# Patient Record
Sex: Female | Born: 1985 | Race: White | Hispanic: No | Marital: Single | Smoking: Former smoker
Health system: Southern US, Community
[De-identification: ages and names within clinical notes are randomized; demographics above are authoritative.]

## PROBLEM LIST (undated history)

## (undated) DIAGNOSIS — R011 Cardiac murmur, unspecified: Secondary | ICD-10-CM

## (undated) DIAGNOSIS — F419 Anxiety disorder, unspecified: Secondary | ICD-10-CM

## (undated) DIAGNOSIS — F32A Depression, unspecified: Secondary | ICD-10-CM

## (undated) HISTORY — DX: Depression, unspecified: F32.A

## (undated) HISTORY — PX: TUBAL LIGATION: SHX77

## (undated) HISTORY — DX: Anxiety disorder, unspecified: F41.9

---

## 2020-03-13 ENCOUNTER — Emergency Department (INDEPENDENT_AMBULATORY_CARE_PROVIDER_SITE_OTHER): Payer: Self-pay

## 2020-03-13 ENCOUNTER — Other Ambulatory Visit: Payer: Self-pay

## 2020-03-13 ENCOUNTER — Encounter: Payer: Self-pay | Admitting: Emergency Medicine

## 2020-03-13 ENCOUNTER — Emergency Department (INDEPENDENT_AMBULATORY_CARE_PROVIDER_SITE_OTHER)
Admission: EM | Admit: 2020-03-13 | Discharge: 2020-03-13 | Disposition: A | Discharge status: Home / Self Care | Payer: Self-pay | Source: Home / Self Care | Attending: Family Medicine | Admitting: Family Medicine

## 2020-03-13 DIAGNOSIS — M25531 Pain in right wrist: Secondary | ICD-10-CM

## 2020-03-13 DIAGNOSIS — M654 Radial styloid tenosynovitis [de Quervain]: Secondary | ICD-10-CM

## 2020-03-13 DIAGNOSIS — M79644 Pain in right finger(s): Secondary | ICD-10-CM

## 2020-03-13 HISTORY — DX: Cardiac murmur, unspecified: R01.1

## 2020-03-13 MED ORDER — PREDNISONE 20 MG PO TABS
ORAL_TABLET | ORAL | 0 refills | Status: DC
Start: 2020-03-13 — End: 2022-02-09

## 2020-03-13 NOTE — ED Triage Notes (Signed)
Woke up yesterday with wrist pain, swelling, denies injury.Shucks oysters for a living

## 2020-03-13 NOTE — ED Provider Notes (Signed)
[facility]    CSN: [id number] Arrival date & time: 03/13/20  1508      History   Chief Complaint Chief Complaint  Patient presents with  . Wrist Pain    HPI Katrina Pineda is a 34 y.o. female.   Patient awoke yesterday with pain in her right thumb and wrist.  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  She denies paresthesias but states that she has right hand weakness.  The history is provided by the patient.  Hand Pain This is a new problem. The current episode started yesterday. The problem occurs constantly. The problem has been gradually worsening. The symptoms are aggravated by coughing (grasping). Nothing relieves the symptoms. Treatments tried: ice pack. The treatment provided no relief.    Past Medical History:  Diagnosis Date  . Heart murmur     There are no problems to display for this patient.   History reviewed. No pertinent surgical history.  OB History   No obstetric history on file.      Home Medications    Prior to Admission medications   Medication Sig Start Date End Date Taking? Authorizing Provider  ibuprofen (ADVIL) 600 MG tablet Take 600 mg by mouth every 6 (six) hours as needed.   Yes [provider]  predniSONE (DELTASONE) 20 MG tablet Take one tab by mouth twice daily for 4 days, then one daily. Take with food. 03/13/20   Lattie Haw, MD    Family History Family History  Problem Relation Age of Onset  . Cancer Mother   . Diabetes Father     Social History Social History   Tobacco Use  . Smoking status: Current Every Day Smoker    Packs/day: 1.00    Types: Cigarettes  . Smokeless tobacco: Never Used  Vaping Use  . Vaping Use: Never used  Substance Use Topics  . Alcohol use: Yes  . Drug use: Never     Allergies   Patient has no allergy information on record.   Review of Systems Review of Systems  Constitutional: Negative for activity change, chills,  diaphoresis, fatigue and fever.  Musculoskeletal: Positive for joint swelling.  Skin: Negative.   Neurological: Positive for weakness. Negative for numbness.     Physical Exam Triage Vital Signs ED Triage Vitals  Enc Vitals Group     BP 03/13/20 1538 127/88     Pulse Rate 03/13/20 1538 82     Resp --      Temp 03/13/20 1538 98.3 F (36.8 C)     Temp Source 03/13/20 1538 Oral     SpO2 03/13/20 1538 96 %     Weight 03/13/20 1539 200 lb (90.7 kg)     Height 03/13/20 1539 5\' 4"  (1.626 m)     Head Circumference --      Peak Flow --      Pain Score 03/13/20 1538 5     Pain Loc --      Pain Edu? --      Excl. in GC? --    No data found.  Updated Vital Signs BP 127/88 (BP Location: Right Arm)   Pulse 82   Temp 98.3 F (36.8 C) (Oral)   Ht 5\' 4"  (1.626 m)   Wt 90.7 kg   SpO2 96%   BMI 34.33 kg/m   Visual Acuity Right Eye Distance:   Left Eye Distance:   Bilateral Distance:    Right  Eye Near:   Left Eye Near:    Bilateral Near:     Physical Exam Constitutional:      General: She is not in acute distress. HENT:     Head: Normocephalic.  Eyes:     Pupils: Pupils are equal, round, and reactive to light.  Cardiovascular:     Rate and Rhythm: Normal rate.  Pulmonary:     Effort: Pulmonary effort is normal.  Musculoskeletal:        General: Swelling and tenderness present.     Right wrist: Tenderness and bony tenderness present. No deformity, effusion, lacerations, snuff box tenderness or crepitus. Decreased range of motion. Normal pulse.       Hands:     Comments: Right thumb/wrist dorsally have distinct tenderness over the extensor tendons.  Palpation there during resisted extension and abduction of the thumb recreate her pain.  Distal neurovascular function is intact.    Skin:    General: Skin is warm and dry.  Neurological:     Mental Status: She is alert and oriented to person, place, and time.      UC Treatments / Results  Labs (all labs ordered are  listed, but only abnormal results are displayed) Labs Reviewed - No data to display  EKG   Radiology DG Wrist Complete Right  Result Date: 03/13/2020 CLINICAL DATA:  RIGHT wrist and thumb pain. EXAM: RIGHT WRIST - COMPLETE 3+ VIEW COMPARISON:  None. FINDINGS: There is no evidence of fracture or dislocation. There is no evidence of arthropathy or other focal bone abnormality. Soft tissues are unremarkable. IMPRESSION: Negative. Electronically Signed   By: Norva Pavlov M.D.   On: 03/13/2020 17:01    Procedures Procedures (including critical care time)  Medications Ordered in UC Medications - No data to display  Initial Impression / Assessment and Plan / UC Course  I have reviewed the triage vital signs and the nursing notes.  Pertinent labs & imaging results that were available during my care of the patient were reviewed by me and considered in my medical decision making (see chart for details).    Dispensed thumb spica splint.  Begin prednisone burst/taper. Given treatment instructions with range of motion and stretching exercises.  Followup with Dr. [facility]ney [facility] ([facility]) if not improving about two weeks.    Final Clinical Impressions(s) / UC Diagnoses   Final diagnoses:  Tendinitis, de Quervain's     Discharge Instructions     Apply ice pack for 20 to 30 minutes, 3 to 4 times daily  Continue until pain and swelling decrease.  Wear splint until pain resolves.  Begin range of motion and stretching exercises as tolerated. May take Tylenol as needed for pain.    ED Prescriptions    Medication Sig Dispense Auth. Provider   predniSONE (DELTASONE) 20 MG tablet Take one tab by mouth twice daily for 4 days, then one daily. Take with food. 12 tablet Lattie Haw, MD        Lattie Haw, MD 03/14/20 [phone]

## 2020-03-13 NOTE — Discharge Instructions (Addendum)
Apply ice pack for 20 to 30 minutes, 3 to 4 times daily  Continue until pain and swelling decrease.  Wear splint until pain resolves.  Begin range of motion and stretching exercises as tolerated. May take Tylenol as needed for pain.

## 2021-08-21 IMAGING — DX DG WRIST COMPLETE 3+V*R*
4 series · 4 of 4 positions shown · non-contrast
Comparison: None.

CLINICAL DATA: RIGHT wrist and thumb pain.

EXAM:
RIGHT WRIST - COMPLETE 3+ VIEW

[wrist pa]
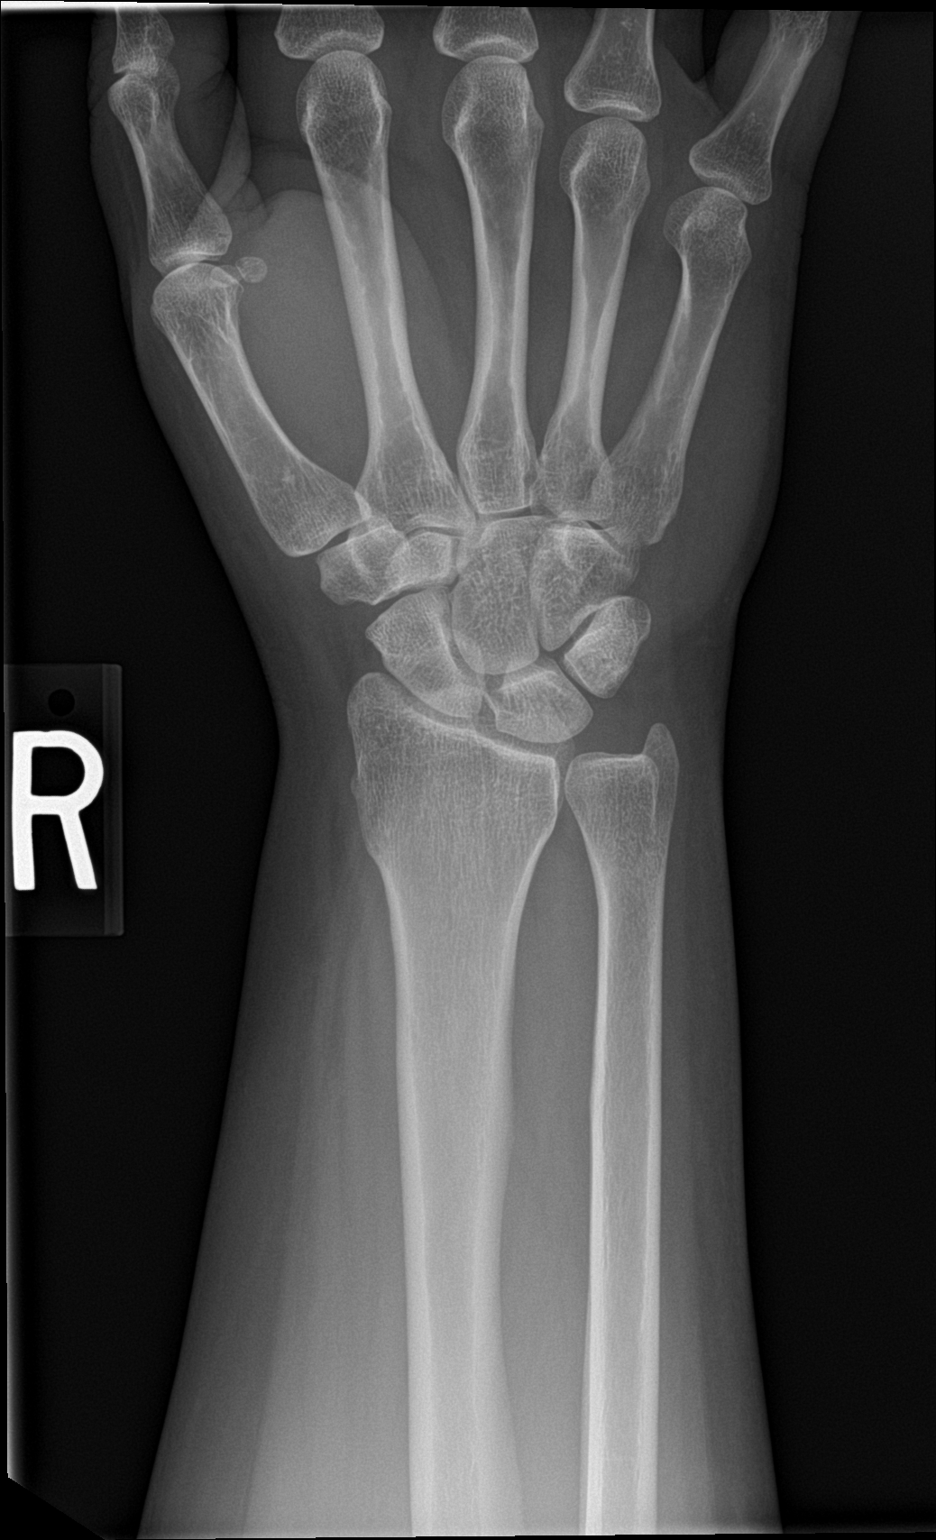

[wrist obl]
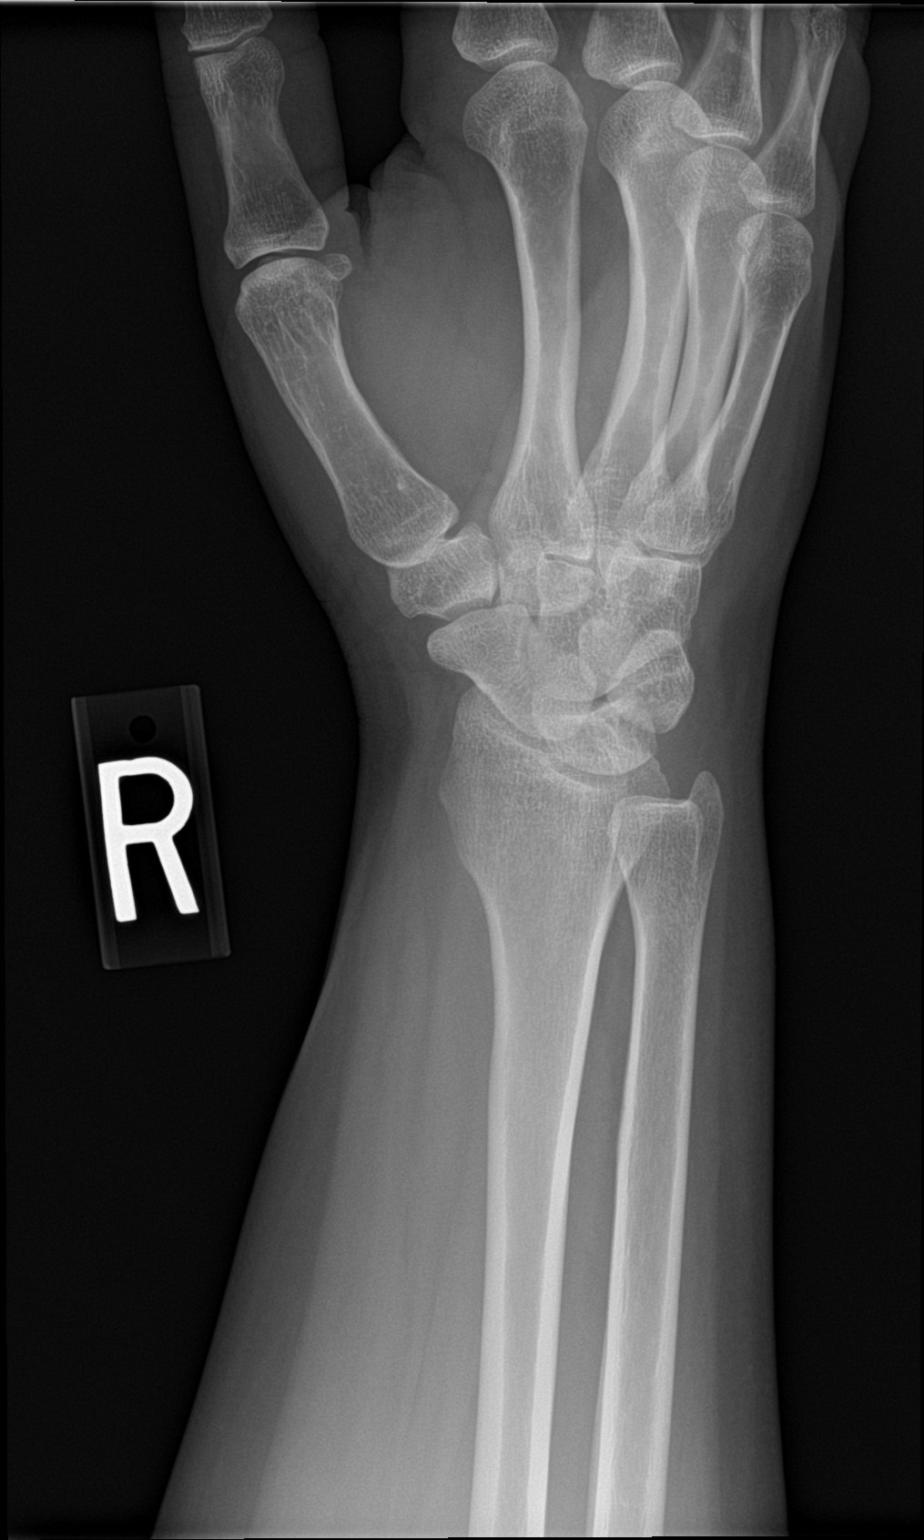

[wrist lat]
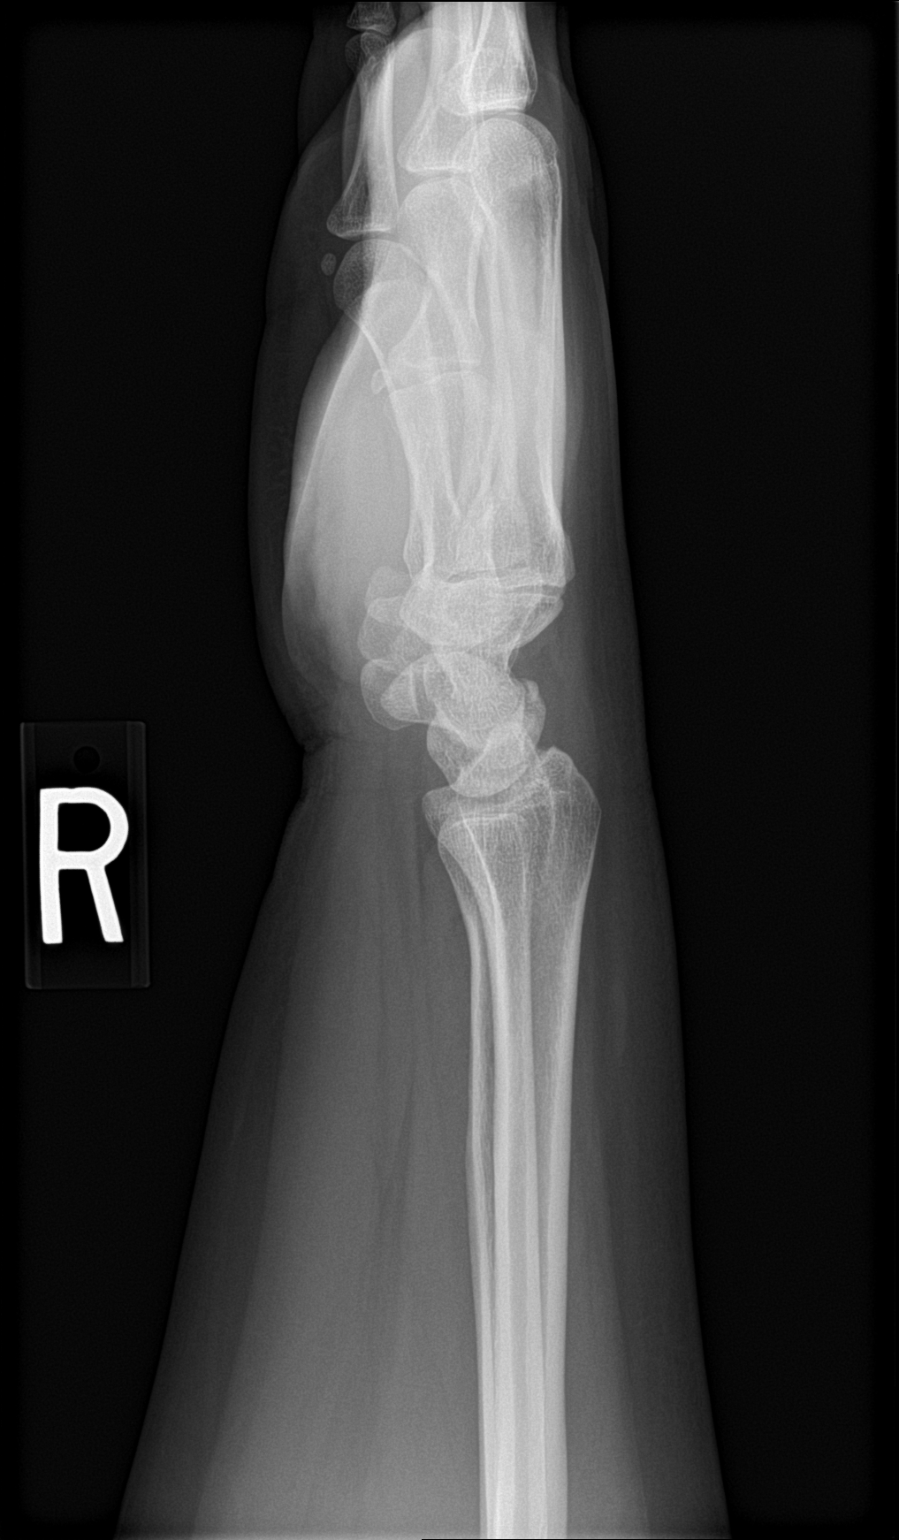

[wrist navicular]
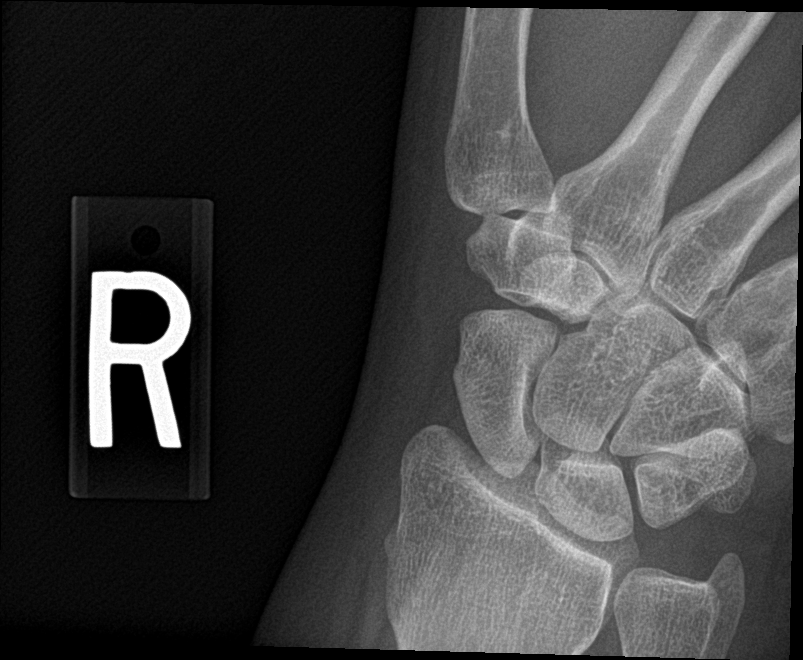

[4 of 4 positions shown; findings below may reference images not displayed]

FINDINGS: There is no evidence of fracture or dislocation. There is no
evidence of arthropathy or other focal bone abnormality. Soft
tissues are unremarkable.
IMPRESSION: Negative.

## 2021-12-15 ENCOUNTER — Ambulatory Visit: Payer: Self-pay | Admitting: Family Medicine

## 2021-12-26 ENCOUNTER — Ambulatory Visit: Payer: Self-pay | Admitting: Family Medicine

## 2022-02-09 ENCOUNTER — Ambulatory Visit (INDEPENDENT_AMBULATORY_CARE_PROVIDER_SITE_OTHER): Payer: Commercial Managed Care - PPO | Admitting: Family Medicine

## 2022-02-09 ENCOUNTER — Encounter: Payer: Self-pay | Admitting: Family Medicine

## 2022-02-09 VITALS — BP 107/67 | HR 82 | Temp 98.1°F | Ht 64.0 in | Wt 248.0 lb

## 2022-02-09 DIAGNOSIS — Z7689 Persons encountering health services in other specified circumstances: Secondary | ICD-10-CM

## 2022-02-09 DIAGNOSIS — Z3A25 25 weeks gestation of pregnancy: Secondary | ICD-10-CM

## 2022-02-09 DIAGNOSIS — F32A Depression, unspecified: Secondary | ICD-10-CM | POA: Diagnosis not present

## 2022-02-09 DIAGNOSIS — F419 Anxiety disorder, unspecified: Secondary | ICD-10-CM

## 2022-02-09 DIAGNOSIS — R011 Cardiac murmur, unspecified: Secondary | ICD-10-CM | POA: Diagnosis not present

## 2022-02-09 NOTE — Patient Instructions (Signed)
Exercise During Pregnancy Exercise is an important part of being healthy for people of all ages. Exercise improves the function of your heart and lungs and helps you maintain strength, flexibility, and a healthy body weight. Exercise also boosts energy levels and elevates mood. Most women should exercise regularly during pregnancy. Exercise routines may need to change as your pregnancy progresses. In rare cases, women with certain medical conditions or complications may be asked to limit or avoid exercise during pregnancy. Your health care provider will give you information on what will work for you. How does this affect me? Along with maintaining general strength and flexibility, exercising during pregnancy can help: Keep strength in muscles that are used during labor and childbirth. Decrease low back pain or symptoms of depression. Control weight gain during pregnancy. Reduce the risk of needing insulin if you develop diabetes during pregnancy. Decrease the risk of cesarean delivery. Speed up your recovery after giving birth. Relieve constipation. How does this affect my baby? Exercise can help you have a healthy pregnancy. Exercise does not cause early (premature) birth. It will not cause your baby to weigh less at birth. What exercises can I do? Many exercises are safe for you to do during pregnancy. Do a variety of exercises that safely increase your heart and breathing rates and help you build and maintain muscle strength. Do exercises exactly as told by your health care provider. You may do these exercises: Walking. Swimming. Water aerobics. Riding a stationary bike. Modified yoga or Pilates. Tell your instructor that you are pregnant. Avoid overstretching, and avoid lying on your back for long periods of time. Running or jogging. Choose this type of exercise only if: You ran or jogged regularly before your pregnancy. You can run or jog and still talk in complete sentences. What  exercises should I avoid? You may be told to limit high-intensity exercise depending on your level of fitness and whether you exercised regularly before you were pregnant. You can tell that you are exercising at a high intensity if you are breathing much harder and faster and cannot hold a conversation while exercising. You must avoid: Contact sports. Activities that put you at risk for falling on or being hit in the belly, such as downhill skiing, waterskiing, surfing, rock climbing, cycling, gymnastics, and horseback riding. Scuba diving. Skydiving. Hot yoga or hot Pilates. These activities take place in a room that is heated to high temperatures. Jogging or running, unless you jogged or ran regularly before you were pregnant. While jogging or running, you should always be able to talk in full sentences. Do not run or jog so fast that you are unable to have a conversation. Do not exercise at more than 6,000 feet above sea level (high elevation) if you are not used to exercising at high elevation. How do I exercise in a safe way?  Avoid overheating. Do not exercise in very high temperatures. Wear loose-fitting, breathable clothes. Avoid dehydration. Drink enough fluid before, during, and after exercise to keep your urine pale yellow. Avoid overstretching. Because of hormone changes during pregnancy, it is easy to overstretch muscles, tendons, and ligaments. Start slowly and ask your health care provider to recommend the types of exercise that are safe for you. Do not exercise to lose weight. Wear a sports bra to support your breasts. Avoid standing still or lying flat on your back as much as you can. Follow these instructions at home: Exercise on most days or all days of the week. Try to   exercise for 30 minutes a day, 5 days a week, unless your health care provider tells you not to. If you actively exercised before your pregnancy and you are healthy, your health care provider may tell you to  continue to do moderate-intensity to high-intensity exercise. If you are just starting to exercise or did not exercise much before your pregnancy, your health care provider may tell you to do low-intensity to moderate-intensity exercise. Questions to ask your health care provider Is exercise safe for me? What are signs that I should stop exercising? Does my health condition mean that I should not exercise during pregnancy? When should I avoid exercising during pregnancy? Stop exercising and contact a health care provider if: You have any unusual symptoms such as: Mild contractions of the uterus or cramps in the abdomen. A dizzy feeling that does not go away when you rest. Stop exercising and get help right away if: You have any unusual symptoms such as: Sudden, severe pain in your low back or your belly. Regular, painful contractions of your uterus. Chest pain. Bleeding or fluid leaking from your vagina. Shortness of breath. Headache. Pain and swelling of your calves. Summary Most women should exercise regularly throughout pregnancy. In rare cases, women with certain medical conditions or complications may be asked to limit or avoid exercise during pregnancy. Do not exercise to lose weight during pregnancy. Your health care provider will tell you what level of physical activity is right for you. Stop exercising and contact a health care provider if you have unusual symptoms, such as mild contractions or dizziness. This information is not intended to replace advice given to you by your health care provider. Make sure you discuss any questions you have with your health care provider. Document Revised: 03/27/2020 Document Reviewed: 03/27/2020 Elsevier Patient Education  2023 [employer]  

## 2022-02-09 NOTE — Progress Notes (Signed)
New Patient Office Visit  Subjective    Patient ID: Katrina Pineda, female    DOB: December 14, 1985  Age: 35 y.o. MRN: [id number]  CC:  Chief Complaint  Patient presents with   New Patient (Initial Visit)    HPI Katrina Pineda presents to establish care.  She is [redacted] weeks pregnant. She is managed by [facility] in [place]. She reports a healthy pregnancy so far. This is her second pregnancy. She has been on zoloft for the last few months for anxiety and depression. Reports working well without side effects.   She reports a heart murmur since childhood. She was evaluated by cardiology as a child but has not been since since she was a teenager. She was told that she would outgrow this. She reports that she has been told off and no throughout adulthood that she has a murmur. Denies chest pain, shortness of breath, edema, or dizziness.      02/09/2022    2:19 PM  Depression screen PHQ 2/9  Decreased Interest 0  Down, Depressed, Hopeless 0  PHQ - 2 Score 0  Altered sleeping 0  Tired, decreased energy 0  Change in appetite 0  Feeling bad or failure about yourself  0  Trouble concentrating 0  Moving slowly or fidgety/restless 0  Suicidal thoughts 0  PHQ-9 Score 0  Difficult doing work/chores Not difficult at all      02/09/2022    2:20 PM  GAD 7 : Generalized Anxiety Score  Nervous, Anxious, on Edge 0  Control/stop worrying 0  Worry too much - different things 0  Trouble relaxing 0  Restless 0  Easily annoyed or irritable 0  Afraid - awful might happen 0  Total GAD 7 Score 0  Anxiety Difficulty Not difficult at all     Outpatient Encounter Medications as of 02/09/2022  Medication Sig   Prenatal Vit-Fe Fumarate-FA (PRENATAL VITAMINS PO) Take by mouth.   sertraline (ZOLOFT) 50 MG tablet Take 50 mg by mouth daily.   [DISCONTINUED] ibuprofen (ADVIL) 600 MG tablet Take 600 mg by mouth every 6 (six) hours as needed.   [DISCONTINUED] predniSONE (DELTASONE) 20 MG tablet Take one  tab by mouth twice daily for 4 days, then one daily. Take with food.   No facility-administered encounter medications on file as of 02/09/2022.    Past Medical History:  Diagnosis Date   Anxiety    Depression    Heart murmur     History reviewed. No pertinent surgical history.  Family History  Problem Relation Age of Onset   Anxiety disorder Mother    Alcohol abuse Mother    Cancer Mother    Hypertension Father    Hyperlipidemia Father    Diabetes Father    Cancer Sister    Cancer Maternal Grandmother    COPD Paternal Grandfather     Social History   Socioeconomic History   Marital status: Married    Spouse name: Onalee Hua   Number of children: 1   Years of education: 11   Highest education level: GED or equivalent  Occupational History   Not on file  Tobacco Use   Smoking status: Former    Packs/day: 1.00    Years: 18.00    Total pack years: 18.00    Types: Cigarettes    Quit date: 2022    Years since quitting: 1.4   Smokeless tobacco: Never  Vaping Use   Vaping Use: Never used  Substance and Sexual Activity   Alcohol  use: Not Currently   Drug use: Never   Sexual activity: Yes    Birth control/protection: None  Other Topics Concern   Not on file  Social History Narrative   Not on file   Social Determinants of Health   Financial Resource Strain: Not on file  Food Insecurity: Not on file  Transportation Needs: Not on file  Physical Activity: Not on file  Stress: Not on file  Social Connections: Not on file  Intimate Partner Violence: Not on file    ROS As per HPI.     Objective    BP 107/67   Pulse 82   Temp 98.1 F (36.7 C) (Temporal)   Ht 5\' 4"  (1.626 m)   Wt 248 lb (112.5 kg)   SpO2 97%   BMI 42.57 kg/m   Physical Exam Vitals and nursing note reviewed.  Constitutional:      General: She is not in acute distress.    Appearance: She is not ill-appearing, toxic-appearing or diaphoretic.  HENT:     Head: Normocephalic and  atraumatic.     Right Ear: Tympanic membrane, ear canal and external ear normal.     Left Ear: Tympanic membrane and external ear normal.     Nose: Nose normal.     Mouth/Throat:     Mouth: Mucous membranes are moist.     Pharynx: Oropharynx is clear.  Neck:     Vascular: No JVD.  Cardiovascular:     Heart sounds: Murmur heard.     Systolic murmur is present with a grade of 3/6.     No friction rub. No gallop. No S3 or S4 sounds.  Pulmonary:     Effort: Pulmonary effort is normal. No respiratory distress.     Breath sounds: Normal breath sounds.  Musculoskeletal:     Right lower leg: No edema.     Left lower leg: No edema.  Skin:    General: Skin is warm and dry.  Neurological:     General: No focal deficit present.     Mental Status: She is alert and oriented to person, place, and time.  Psychiatric:        Mood and Affect: Mood normal.        Thought Content: Thought content normal.        Judgment: Judgment normal.         Assessment & Plan:   Katrina Pineda was seen today for new patient (initial visit).  Diagnoses and all orders for this visit:  Heart murmur Asymptomatic. From childhood.    Morbid obesity (HCC) BMI is 42. Healthy well balanced diet and exercise as tolerated given pregnant stable.   [redacted] weeks gestation of pregnancy Managed by OB.  Anxiety and depression Well controlled with zoloft. Managed by OB.   Encounter to establish care Awaiting records.   Return in about 24 weeks (around 07/27/2022) for CPE.   The patient indicates understanding of these issues and agrees with the plan.  14/11/2021, FNP

## 2022-08-03 ENCOUNTER — Encounter: Payer: Commercial Managed Care - PPO | Admitting: Family Medicine

## 2022-12-31 ENCOUNTER — Encounter: Payer: Self-pay | Admitting: Family Medicine

## 2022-12-31 ENCOUNTER — Ambulatory Visit (INDEPENDENT_AMBULATORY_CARE_PROVIDER_SITE_OTHER): Payer: Commercial Managed Care - PPO

## 2022-12-31 ENCOUNTER — Ambulatory Visit (INDEPENDENT_AMBULATORY_CARE_PROVIDER_SITE_OTHER): Payer: Commercial Managed Care - PPO | Admitting: Family Medicine

## 2022-12-31 VITALS — BP 129/82 | HR 87 | Temp 98.6°F | Ht 64.0 in | Wt 266.1 lb

## 2022-12-31 DIAGNOSIS — F32A Depression, unspecified: Secondary | ICD-10-CM | POA: Diagnosis not present

## 2022-12-31 DIAGNOSIS — F419 Anxiety disorder, unspecified: Secondary | ICD-10-CM

## 2022-12-31 DIAGNOSIS — M5441 Lumbago with sciatica, right side: Secondary | ICD-10-CM

## 2022-12-31 DIAGNOSIS — M5442 Lumbago with sciatica, left side: Secondary | ICD-10-CM

## 2022-12-31 DIAGNOSIS — G8929 Other chronic pain: Secondary | ICD-10-CM | POA: Diagnosis not present

## 2022-12-31 MED ORDER — HYDROXYZINE PAMOATE 25 MG PO CAPS: 25.0000 mg | ORAL_CAPSULE | Freq: Three times a day (TID) | ORAL | 0 refills | Status: DC | PRN

## 2022-12-31 MED ORDER — MELOXICAM 15 MG PO TABS: 15.0000 mg | ORAL_TABLET | Freq: Every day | ORAL | 0 refills | Status: DC

## 2022-12-31 NOTE — Progress Notes (Signed)
Acute Office Visit  Subjective:     Patient ID: Katrina Pineda, female    DOB: 07-17-1986, 37 y.o.   MRN: [id number]  Chief Complaint  Patient presents with   Sciatica    HPI Patient is in today for back pain. This has been ongoing for years and has been worsening. Reports pain is on both side, most often on right. She reports that pain is intermittent. Worsening with activity or standing for long periods. She reports sharp shoot pains that radiates down her back side and back of her legs. She reports intermittent numbness and tingling in both leg and toes at times. She has tried ibuprofen, stretching, heat, and ice with only short term improvement. Denies saddles anesthesia, changes in bowel or bladder control. She has had to limit her activity because of her pain.   She stopped taking zoloft 1-2 months ago. She reports that her symptoms are generally manageable. Would like to have something to have on hand for when her anxiety increases. She does not wish to be on a daily medication.      12/31/2022   10:38 AM 02/09/2022    2:19 PM  Depression screen PHQ 2/9  Decreased Interest 0 0  Down, Depressed, Hopeless 1 0  PHQ - 2 Score 1 0  Altered sleeping 0 0  Tired, decreased energy 2 0  Change in appetite 0 0  Feeling bad or failure about yourself  1 0  Trouble concentrating 0 0  Moving slowly or fidgety/restless 0 0  Suicidal thoughts 0 0  PHQ-9 Score 4 0  Difficult doing work/chores Not difficult at all Not difficult at all      12/31/2022   10:39 AM 02/09/2022    2:20 PM  GAD 7 : Generalized Anxiety Score  Nervous, Anxious, on Edge 1 0  Control/stop worrying 1 0  Worry too much - different things 1 0  Trouble relaxing 1 0  Restless 0 0  Easily annoyed or irritable 2 0  Afraid - awful might happen 0 0  Total GAD 7 Score 6 0  Anxiety Difficulty Not difficult at all Not difficult at all     ROS As per HPI.      Objective:    BP 129/82   Pulse 87   Temp 98.6 F (37  C) (Temporal)   Ht 5\' 4"  (1.626 m)   Wt 266 lb 2 oz (120.7 kg)   SpO2 96%   BMI 45.68 kg/m    Physical Exam Vitals and nursing note reviewed.  Constitutional:      General: She is not in acute distress.    Appearance: She is obese. She is not ill-appearing, toxic-appearing or diaphoretic.  Musculoskeletal:     Lumbar back: No swelling, edema, deformity, signs of trauma, tenderness or bony tenderness. Normal range of motion. Positive right straight leg raise test and positive left straight leg raise test.     Right lower leg: No edema.     Left lower leg: No edema.  Skin:    General: Skin is warm and dry.  Neurological:     General: No focal deficit present.     Mental Status: She is alert and oriented to person, place, and time.  Psychiatric:        Mood and Affect: Mood normal.        Behavior: Behavior normal.     No results found for any visits on 12/31/22.      Assessment &  Plan:   Sevana was seen today for sciatica.  Diagnoses and all orders for this visit:  Chronic bilateral low back pain with bilateral sciatica No red flags. Xray today in office, radiology report pending. Start mobic prn as below. Do not take other NSAIDs with this. Referral to PT ordered. Follow up in 6 weeks. Discussed MRI if no improvement after PT.  -     DG Lumbar Spine 2-3 Views; Future -     meloxicam (MOBIC) 15 MG tablet; Take 1 tablet (15 mg total) by mouth daily. -     Ambulatory referral to Physical Therapy  Anxiety and depression Try hydroxyzine prn.  -     hydrOXYzine (VISTARIL) 25 MG capsule; Take 1 capsule (25 mg total) by mouth every 8 (eight) hours as needed.   Return in about 6 weeks (around 02/11/2023) for back follow up.  The patient indicates understanding of these issues and agrees with the plan.  Gabriel Earing, FNP

## 2022-12-31 NOTE — Patient Instructions (Signed)

## 2023-01-20 ENCOUNTER — Telehealth: Payer: Self-pay | Admitting: Family Medicine

## 2023-01-25 NOTE — Telephone Encounter (Signed)
Left message to call back  

## 2023-01-26 NOTE — Telephone Encounter (Signed)
Pt r/c.

## 2023-01-29 NOTE — Telephone Encounter (Signed)
Spoke w/pt regarding back xray results and FNP's recommendations. Pt voiced understanding and nothing further.

## 2023-02-08 ENCOUNTER — Ambulatory Visit (INDEPENDENT_AMBULATORY_CARE_PROVIDER_SITE_OTHER): Payer: Commercial Managed Care - PPO | Admitting: Nurse Practitioner

## 2023-02-08 ENCOUNTER — Encounter: Payer: Self-pay | Admitting: Nurse Practitioner

## 2023-02-08 ENCOUNTER — Other Ambulatory Visit: Payer: Self-pay

## 2023-02-08 ENCOUNTER — Ambulatory Visit: Payer: Commercial Managed Care - PPO | Attending: Family Medicine

## 2023-02-08 VITALS — BP 129/81 | HR 76 | Temp 97.0°F | Ht 64.0 in | Wt 265.4 lb

## 2023-02-08 DIAGNOSIS — G8929 Other chronic pain: Secondary | ICD-10-CM | POA: Diagnosis present

## 2023-02-08 DIAGNOSIS — Z6841 Body Mass Index (BMI) 40.0 and over, adult: Secondary | ICD-10-CM | POA: Diagnosis not present

## 2023-02-08 DIAGNOSIS — M5442 Lumbago with sciatica, left side: Secondary | ICD-10-CM | POA: Insufficient documentation

## 2023-02-08 DIAGNOSIS — R7303 Prediabetes: Secondary | ICD-10-CM

## 2023-02-08 DIAGNOSIS — Z6791 Unspecified blood type, Rh negative: Secondary | ICD-10-CM | POA: Insufficient documentation

## 2023-02-08 DIAGNOSIS — M5441 Lumbago with sciatica, right side: Secondary | ICD-10-CM | POA: Diagnosis present

## 2023-02-08 LAB — BAYER DCA HB A1C WAIVED: HB A1C (BAYER DCA - WAIVED): 5.8 % — ABNORMAL HIGH (ref 4.8–5.6)

## 2023-02-08 NOTE — Therapy (Signed)
OUTPATIENT PHYSICAL THERAPY THORACOLUMBAR EVALUATION   Patient Name: Katrina Pineda MRN: [id number] DOB:1986/02/19, 37 y.o., female Today's Date: 02/08/2023  END OF SESSION:  PT End of Session - 02/08/23 1247     Visit Number 1    Number of Visits 6    Date for PT Re-Evaluation 04/23/23    PT Start Time 1302    PT Stop Time 1337    PT Time Calculation (min) 35 min    Activity Tolerance Patient tolerated treatment well    Behavior During Therapy [facility] for tasks assessed/performed             Past Medical History:  Diagnosis Date   Anxiety    Depression    Heart murmur    Past Surgical History:  Procedure Laterality Date   CESAREAN SECTION  05/18/2022   TUBAL LIGATION     Patient Active Problem List   Diagnosis Date Noted   RhD negative 02/08/2023   BMI 45.0-49.9, adult (HCC) 02/08/2023   Class 3 severe obesity due to excess calories without serious comorbidity with body mass index (BMI) of 45.0 to 49.9 in adult [facility]) 02/08/2023   Prediabetes 02/08/2023   REFERRING PROVIDER: Gabriel Earing, FNP   REFERRING DIAG: Chronic bilateral low back pain with bilateral sciatica   Rationale for Evaluation and Treatment: Rehabilitation  THERAPY DIAG:  Chronic bilateral low back pain with bilateral sciatica  ONSET DATE: 8 years ago   SUBJECTIVE:                                                                                                                                                                                           SUBJECTIVE STATEMENT: Patient reports that she began having back pain about 8 yeas ago after she had her first child. She had been able to manage her pain until her last pregnancy in September 2023. She notes that it is now hard for her to stand for any periods of time. She has also had shooting pain down her legs, but it has also gotten worse. She notes that it is primarily one leg or the other, but it can be both legs at times. She notes that he  longer she is on her feet the farther down her legs the pain will radiate.   PERTINENT HISTORY:  Obesity  PAIN:  Are you having pain? Yes: NPRS scale: 10/10 Pain location: low back and both legs (primarily one leg or the other, but rarely both)  Pain description: constant shooting, aching Aggravating factors: standing (about 20 minutes,)walking (less than 20 minutes), certain positions sitting, and motion   Relieving factors: medication,  stretching, and Icy Hot  PRECAUTIONS: None  WEIGHT BEARING RESTRICTIONS: No  FALLS:  Has patient fallen in last 6 months? 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 L5 pars defects with 14 mm anterolisthesis of L5 upon S1. 2. L5-S1 disc space narrowing.  SCREENING FOR RED FLAGS: Bowel or bladder incontinence: No Spinal tumors: No Cauda equina syndrome: No Compression fracture: No Abdominal aneurysm: No  COGNITION: Overall cognitive status: Within functional limits for tasks assessed     SENSATION: Patient reports no numbness or tingling currently   PALPATION: TTP: right lumbar paraspinals, and spinous process of L3-5  LUMBAR JOINT MOBILITY:  WFL with L3-5 reproducing her familiar pain  LUMBAR ROM:   AROM eval  Flexion 68  Extension 38; repeated extension (x10 reps) aggravated familiar pain   Right lateral flexion WFL; "pinch" in low back and right hip   Left lateral flexion WFL  Right rotation WFL; "pinch" in low back and right hip   Left rotation WFL    (Blank rows = not tested)  LOWER EXTREMITY ROM: WFL for activities assessed  LOWER EXTREMITY MMT:    MMT  Right eval Left eval  Hip flexion 4+/5 4/5  Hip extension    Hip abduction    Hip adduction    Hip internal rotation    Hip external rotation    Knee flexion 4/5 4/5  Knee extension 4/5 4/5  Ankle dorsiflexion 4+/5 4+/5  Ankle plantarflexion    Ankle inversion    Ankle eversion     (Blank rows = not tested)  GAIT: Assistive device utilized: None Level of assistance: Complete Independence Comments: no significant gait deviations observed  TODAY'S TREATMENT:                                                                                                                              DATE:     PATIENT EDUCATION:  Education details: POC, healing, prognosis, objective measures, and goals for therapy Person educated: Patient Education method: Explanation Education comprehension: verbalized understanding  HOME EXERCISE PROGRAM:   ASSESSMENT:  CLINICAL IMPRESSION: Patient is a 37 y.o. female who was seen today for physical therapy evaluation and treatment for chronic lumbar and lower extremity pain. She presented with moderate to high pain severity and irritability with palpation and lumbar joint mobility assessments reproducing her familiar pain. She exhibited reduced lumbar and lower extremity strength bilaterally. Recommend that she continue with skilled physical therapy to address her impairments to maximize her functional mobility.  OBJECTIVE IMPAIRMENTS: decreased activity tolerance, decreased mobility, difficulty walking, decreased strength, and pain.   ACTIVITY LIMITATIONS:  lifting, standing, locomotion level, and caring for others  PARTICIPATION LIMITATIONS: meal prep, cleaning, laundry, shopping, community activity, and yard work  PERSONAL FACTORS: Profession, Time since onset of injury/illness/exacerbation, and 1 comorbidity: obesity  are also affecting patient's functional outcome.   REHAB POTENTIAL: Fair    CLINICAL DECISION MAKING: Evolving/moderate  complexity  EVALUATION COMPLEXITY: Moderate   GOALS: Goals reviewed with patient? Yes  SHORT TERM GOALS: Target date: 03/01/23  Patient will be independent with her initial HEP.  Baseline: Goal status: INITIAL  2.  Patient will be able to complete her daily activities without her familiar pain exceeding 8/10. Baseline:  Goal status: INITIAL  3.  Patient will be able to walk for at least 30 minutes without being limited by her familiar symptoms.  Baseline:  Goal status: INITIAL  LONG TERM GOALS: Target date: 03/22/23  Patient will be independent with her advanced HEP.  Baseline:  Goal status: INITIAL  2.  Patient will be able to complete her daily activities without her familiar pain exceeding 6/10.  Baseline:  Goal status: INITIAL  3.  Patient will be able to walk for at least 45 minutes without being limited by her familiar symptoms.  Baseline:  Goal status: INITIAL  4.  Patient will improve her left hip flexor strength to at least 4+/5 for improved lower extremity strength. Baseline:  Goal status: INITIAL  PLAN:  PT FREQUENCY: 1-2x/week  PT DURATION: 6 weeks  PLANNED INTERVENTIONS: Therapeutic exercises, Therapeutic activity, Neuromuscular re-education, Balance training, Patient/Family education, Self Care, Joint mobilization, Electrical stimulation, Cryotherapy, Moist heat, Manual therapy, and Re-evaluation.  PLAN FOR NEXT SESSION: NuStep, lumbar strengthening and stabilization, and modalities as needed   Granville Lewis, PT 02/08/2023, 5:08 PM

## 2023-02-08 NOTE — Progress Notes (Signed)
Acute Office Visit  Subjective:     Patient ID: Katrina Pineda, female    DOB: [birth year], 37 y.o.   MRN: [id number]  Chief Complaint  Patient presents with   Weight Loss    Was on Phentermine before. Had good results wants to discuss restarting. Was able to maintain weight until she got pregnant.      HPI Katrina Pineda is 37 yrs. Old female here today to discuss weight loss medication. She had physical done at work and the results raise concerns about weight concerns " I just had a C-section and I a having hard loisng th weight".  Reported 2 years ago she was on Adipex and lost 50 to 60 pounds and would like to go back on it.  Ideal weight is 190 pounds.  Discussed diet and exercise possible referral to nutritionist to help with weight management client agrees Fasting blood glucose from her work lab was 115 pain she was aware she is prediabetic but had question about it so we will order an A1c for confirmation.  No other concerns today ROS Negative unless indicated in HPI    Objective:    BP 129/81   Pulse 76   Temp (!) 97 F (36.1 C) (Temporal)   Ht 5\' 4"  (1.626 m)   Wt 265 lb 6.4 oz (120.4 kg)   SpO2 98%   BMI 45.56 kg/m  BP Readings from Last 3 Encounters:  02/08/23 129/81  12/31/22 129/82  02/09/22 107/67   Wt Readings from Last 3 Encounters:  02/08/23 265 lb 6.4 oz (120.4 kg)  12/31/22 266 lb 2 oz (120.7 kg)  02/09/22 248 lb (112.5 kg)      Physical Exam Vitals and nursing note reviewed.  Constitutional:      Appearance: Normal appearance. She is obese.  HENT:     Head: Normocephalic and atraumatic.  Cardiovascular:     Rate and Rhythm: Normal rate.     Heart sounds: Normal heart sounds.  Pulmonary:     Effort: Pulmonary effort is normal.     Breath sounds: Normal breath sounds.  Abdominal:     General: Bowel sounds are normal.     Palpations: Abdomen is soft.  Skin:    General: Skin is warm and dry.     Findings: No rash.  Neurological:     General:  No focal deficit present.     Mental Status: She is alert and oriented to person, place, and time. Mental status is at baseline.  Psychiatric:        Mood and Affect: Mood normal.        Behavior: Behavior normal.        Thought Content: Thought content normal.        Judgment: Judgment normal.     Results for orders placed or performed in visit on 02/08/23  Bayer DCA Hb A1c Waived  Result Value Ref Range   HB A1C (BAYER DCA - WAIVED) 5.8 (H) 4.8 - 5.6 %        Assessment & Plan:  BMI 45.0-49.9, adult (HCC) -     Amb Ref to Medical Weight Management -     Bayer DCA Hb A1c Waived  Prediabetes -     Amb Ref to Medical Weight Management  Class 3 severe obesity due to excess calories without serious comorbidity with body mass index (BMI) of 45.0 to 49.9 in adult (HCC) -     Amb Ref to Medical Weight Management -  Bayer DCA Hb A1c Waived   Prediabetic/obesity class III A1c of 5.8 Referral to weight management Client to continue working on diet and exercise while waiting for that appointment Return in about 1 month (around 03/10/2023) for follow-up.  Arrie Aran Santa Lighter, NP

## 2023-02-16 ENCOUNTER — Ambulatory Visit: Payer: Commercial Managed Care - PPO

## 2023-02-16 DIAGNOSIS — M5442 Lumbago with sciatica, left side: Secondary | ICD-10-CM | POA: Diagnosis not present

## 2023-02-16 DIAGNOSIS — G8929 Other chronic pain: Secondary | ICD-10-CM

## 2023-02-16 NOTE — Therapy (Signed)
OUTPATIENT PHYSICAL THERAPY THORACOLUMBAR TREATMENT   Patient Name: Katrina Pineda MRN: [id number] DOB:July 31, 1986, 37 y.o., female Today's Date: 02/16/2023  END OF SESSION:  PT End of Session - 02/16/23 1104     Visit Number 2    Number of Visits 6    Date for PT Re-Evaluation 04/23/23    PT Start Time 1102    PT Stop Time 1143    PT Time Calculation (min) 41 min    Activity Tolerance Patient tolerated treatment well    Behavior During Therapy [facility] for tasks assessed/performed              Past Medical History:  Diagnosis Date   Anxiety    Depression    Heart murmur    Past Surgical History:  Procedure Laterality Date   CESAREAN SECTION  05/18/2022   TUBAL LIGATION     Patient Active Problem List   Diagnosis Date Noted   RhD negative 02/08/2023   BMI 45.0-49.9, adult (HCC) 02/08/2023   Class 3 severe obesity due to excess calories without serious comorbidity with body mass index (BMI) of 45.0 to 49.9 in adult [facility]) 02/08/2023   Prediabetes 02/08/2023   REFERRING PROVIDER: Gabriel Earing, FNP   REFERRING DIAG: Chronic bilateral low back pain with bilateral sciatica   Rationale for Evaluation and Treatment: Rehabilitation  THERAPY DIAG:  Chronic bilateral low back pain with bilateral sciatica  ONSET DATE: 8 years ago   SUBJECTIVE:                                                                                                                                                                                           SUBJECTIVE STATEMENT: Patient reports that she is not hurting today. However, she was hurting some last night after doing housework.   PERTINENT HISTORY:  Obesity  PAIN:  Are you having pain? Yes: NPRS scale: 0/10 Pain location: low back and both legs (primarily one leg or the other, but rarely both)  Pain description: constant shooting, aching Aggravating factors: standing (about 20 minutes,)walking (less than 20 minutes), certain positions  sitting, and motion   Relieving factors: medication, stretching, and Icy Hot  PRECAUTIONS: None  WEIGHT BEARING RESTRICTIONS: No  FALLS:  Has patient fallen in last 6 months? 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 L5 pars defects with 14 mm anterolisthesis of L5 upon S1. 2. L5-S1 disc space narrowing.  SCREENING FOR RED FLAGS: Bowel or bladder incontinence: No Spinal tumors: No Cauda equina syndrome: No Compression fracture: No Abdominal aneurysm: No  COGNITION: Overall cognitive status: Within functional limits for tasks assessed     SENSATION: Patient reports no numbness or tingling currently   PALPATION: TTP: right lumbar paraspinals, and spinous process of L3-5  LUMBAR JOINT MOBILITY:  WFL with L3-5 reproducing her familiar pain  LUMBAR ROM:   AROM eval  Flexion 68  Extension 38; repeated extension (x10 reps) aggravated familiar pain   Right lateral flexion WFL; "pinch" in low back and right hip   Left lateral flexion WFL  Right rotation WFL; "pinch" in low back and right hip   Left rotation WFL    (Blank rows = not tested)  LOWER EXTREMITY ROM: WFL for activities assessed  LOWER EXTREMITY MMT:    MMT Right eval Left eval  Hip flexion 4+/5 4/5  Hip extension    Hip abduction    Hip adduction    Hip internal rotation    Hip external rotation    Knee flexion 4/5 4/5  Knee extension 4/5 4/5  Ankle dorsiflexion 4+/5 4+/5  Ankle plantarflexion    Ankle inversion    Ankle eversion     (Blank rows =  not tested)  GAIT: Assistive device utilized: None Level of assistance: Complete Independence Comments: no significant gait deviations observed  TODAY'S TREATMENT:                                                                                                                              DATE:                                     02/16/23 EXERCISE LOG  Exercise Repetitions and Resistance Comments  Nustep  L3-4 x 15 minutes   Resisted pull down  Blue XTS x 2 minutes   Ball roll out  2 minutes   Self STM with ball  Attempted, but held due to increased discomfort   LTR 1.5 minutes Right low back "pulling" progressing to shooting pain        Blank cell = exercise not performed today  Manual Therapy Soft Tissue Mobilization: right QL, lumbar paraspinals, and gluteals, for reduced pain and tone with good results    PATIENT EDUCATION:  Education details: POC, healing, prognosis, objective measures, and goals for therapy Person educated: Patient Education method: Explanation Education comprehension: verbalized understanding  HOME EXERCISE PROGRAM:   ASSESSMENT:  CLINICAL IMPRESSION: Patient was introduced to multiple new interventions for improved lumbar mobility and strengthening. She experienced a mild increase in her familiar symptoms with lower trunk rotations and self  soft tissue mobilization. However, this was able to be resolved with soft tissue mobilization to her right quadratus lumborum. She reported that she was not hurting upon the conclusion of treatment. She continues to require skilled physical therapy to address her remaining impairments to return to her prior level of function.    OBJECTIVE IMPAIRMENTS: decreased activity tolerance, decreased mobility, difficulty walking, decreased strength, and pain.   ACTIVITY LIMITATIONS: lifting, standing, locomotion level, and caring for others  PARTICIPATION LIMITATIONS: meal prep, cleaning, laundry, shopping, community activity,  and yard work  PERSONAL FACTORS: Profession, Time since onset of injury/illness/exacerbation, and 1 comorbidity: obesity  are also affecting patient's functional outcome.   REHAB POTENTIAL: Fair    CLINICAL DECISION MAKING: Evolving/moderate complexity  EVALUATION COMPLEXITY: Moderate   GOALS: Goals reviewed with patient? Yes  SHORT TERM GOALS: Target date: 03/01/23  Patient will be independent with her initial HEP.  Baseline: Goal status: INITIAL  2.  Patient will be able to complete her daily activities without her familiar pain exceeding 8/10. Baseline:  Goal status: INITIAL  3.  Patient will be able to walk for at least 30 minutes without being limited by her familiar symptoms.  Baseline:  Goal status: INITIAL  LONG TERM GOALS: Target date: 03/22/23  Patient will be independent with her advanced HEP.  Baseline:  Goal status: INITIAL  2.  Patient will be able to complete her daily activities without her familiar pain exceeding 6/10.  Baseline:  Goal status: INITIAL  3.  Patient will be able to walk for at least 45 minutes without being limited by her familiar symptoms.  Baseline:  Goal status: INITIAL  4.  Patient will improve her left hip flexor strength to at least 4+/5 for improved lower extremity strength. Baseline:  Goal status: INITIAL  PLAN:  PT FREQUENCY: 1-2x/week  PT DURATION: 6 weeks  PLANNED INTERVENTIONS: Therapeutic exercises, Therapeutic activity, Neuromuscular re-education, Balance training, Patient/Family education, Self Care, Joint mobilization, Electrical stimulation, Cryotherapy, Moist heat, Manual therapy, and Re-evaluation.  PLAN FOR NEXT SESSION: NuStep, lumbar strengthening and stabilization, and modalities as needed   Granville Lewis, PT 02/16/2023, 12:08 PM

## 2023-02-19 ENCOUNTER — Ambulatory Visit: Payer: Commercial Managed Care - PPO | Admitting: Family Medicine

## 2023-02-23 ENCOUNTER — Ambulatory Visit: Payer: Commercial Managed Care - PPO | Attending: Family Medicine

## 2023-02-23 DIAGNOSIS — G8929 Other chronic pain: Secondary | ICD-10-CM | POA: Insufficient documentation

## 2023-02-23 DIAGNOSIS — M5441 Lumbago with sciatica, right side: Secondary | ICD-10-CM | POA: Diagnosis present

## 2023-02-23 DIAGNOSIS — M5442 Lumbago with sciatica, left side: Secondary | ICD-10-CM | POA: Insufficient documentation

## 2023-02-23 NOTE — Therapy (Signed)
OUTPATIENT PHYSICAL THERAPY THORACOLUMBAR TREATMENT   Patient Name: Katrina Pineda MRN: [id number] DOB:1985-09-13, 37 y.o., female Today's Date: 02/23/2023  END OF SESSION:  PT End of Session - 02/23/23 1309     Visit Number 3    Number of Visits 6    Date for PT Re-Evaluation 04/23/23    PT Start Time 1307   Patient arrived late to her appointment.   PT Stop Time 1342    PT Time Calculation (min) 35 min    Activity Tolerance Patient tolerated treatment well    Behavior During Therapy WFL for tasks assessed/performed              Past Medical History:  Diagnosis Date   Anxiety    Depression    Heart murmur    Past Surgical History:  Procedure Laterality Date   CESAREAN SECTION  05/18/2022   TUBAL LIGATION     Patient Active Problem List   Diagnosis Date Noted   RhD negative 02/08/2023   BMI 45.0-49.9, adult (HCC) 02/08/2023   Class 3 severe obesity due to excess calories without serious comorbidity with body mass index (BMI) of 45.0 to 49.9 in adult [facility]) 02/08/2023   Prediabetes 02/08/2023   REFERRING PROVIDER: Gabriel Earing, FNP   REFERRING DIAG: Chronic bilateral low back pain with bilateral sciatica   Rationale for Evaluation and Treatment: Rehabilitation  THERAPY DIAG:  Chronic bilateral low back pain with bilateral sciatica  ONSET DATE: 8 years ago   SUBJECTIVE:                                                                                                                                                                                           SUBJECTIVE STATEMENT: Patient reports that she has been hurting a little more this week. She notes that nothing in particular has caused her to hurt more, but she feels alright today because she has not done much. She notes that she felt good after her last appointment.   PERTINENT HISTORY:  Obesity  PAIN:  Are you having pain? Yes: NPRS scale: 2-3/10 Pain location: low back and both legs (primarily one  leg or the other, but rarely both)  Pain description: constant shooting, aching Aggravating factors: standing (about 20 minutes,)walking (less than 20 minutes), certain positions sitting, and motion   Relieving factors: medication, stretching, and Icy Hot  PRECAUTIONS: None  WEIGHT BEARING RESTRICTIONS: No  FALLS:  Has patient fallen in last 6 months? 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 L5 pars defects with 14 mm anterolisthesis of L5 upon S1. 2. L5-S1 disc space narrowing.  SCREENING FOR RED FLAGS: Bowel or bladder incontinence: No Spinal tumors: No Cauda equina syndrome: No Compression fracture: No Abdominal aneurysm: No  COGNITION: Overall cognitive status: Within functional limits for tasks assessed     SENSATION: Patient reports no numbness or tingling currently   PALPATION: TTP: right lumbar paraspinals, and spinous process of L3-5  LUMBAR JOINT MOBILITY:  WFL with L3-5 reproducing her familiar pain  LUMBAR ROM:   AROM eval  Flexion 68  Extension 38; repeated extension (x10 reps) aggravated familiar pain   Right lateral flexion WFL; "pinch" in low back and right hip   Left lateral flexion WFL  Right rotation WFL; "pinch" in low back and right hip   Left rotation WFL    (Blank rows = not tested)  LOWER EXTREMITY ROM: WFL for activities assessed  LOWER EXTREMITY MMT:    MMT Right eval Left eval  Hip flexion 4+/5 4/5  Hip extension    Hip abduction    Hip adduction    Hip internal rotation    Hip  external rotation    Knee flexion 4/5 4/5  Knee extension 4/5 4/5  Ankle dorsiflexion 4+/5 4+/5  Ankle plantarflexion    Ankle inversion    Ankle eversion     (Blank rows = not tested)  GAIT: Assistive device utilized: None Level of assistance: Complete Independence Comments: no significant gait deviations observed  TODAY'S TREATMENT:                                                                                                                              DATE:                                     02/23/23 EXERCISE LOG  Exercise Repetitions and Resistance Comments  Nustep  L3 x 15 minutes   Resisted pull down  Blue XTS x 2 minutes   Multifidus press out  Green t-band x 2 minutes each    Modified bird dog  2 minutes each At counter; UE only (slight reproduction of familiar numbness as this activity continued)   Ball roll out  3 minutes    Rocker board  4 minutes   Standing hip ABD 2 minutes  Alternating LE   Blank cell = exercise not performed today  02/16/23 EXERCISE LOG  Exercise Repetitions and Resistance Comments  Nustep  L3-4 x 15 minutes   Resisted pull down  Blue XTS x 2 minutes   Ball roll out  2 minutes   Self STM with ball  Attempted, but held due to increased discomfort   LTR 1.5 minutes Right low back "pulling" progressing to shooting pain        Blank cell = exercise not performed today  Manual Therapy Soft Tissue Mobilization: right QL, lumbar paraspinals, and gluteals, for reduced pain and tone with good results    PATIENT EDUCATION:  Education details: POC, healing, prognosis, objective measures, and goals for therapy Person educated: Patient Education method: Explanation Education comprehension: verbalized understanding  HOME EXERCISE PROGRAM:   ASSESSMENT:  CLINICAL IMPRESSION: Patient was introduced to multiple new interventions for improved lumbar strength and stability. She required minimal cueing with today's  new interventions for proper positioning to promote upper extremity mobility with lumbar stability. She experienced mild reproduction in her familiar numbness with a modified bird dog, but this did not inhibit her ability to complete any of today's interventions. She reported feeling a little better upon the conclusion of treatment. She continues to require skilled physical therapy to address her remaining impairments to return to her prior level of function.    OBJECTIVE IMPAIRMENTS: decreased activity tolerance, decreased mobility, difficulty walking, decreased strength, and pain.   ACTIVITY LIMITATIONS: lifting, standing, locomotion level, and caring for others  PARTICIPATION LIMITATIONS: meal prep, cleaning, laundry, shopping, community activity, and yard work  PERSONAL FACTORS: Profession, Time since onset of injury/illness/exacerbation, and 1 comorbidity: obesity  are also affecting patient's functional outcome.   REHAB POTENTIAL: Fair    CLINICAL DECISION MAKING: Evolving/moderate complexity  EVALUATION COMPLEXITY: Moderate   GOALS: Goals reviewed with patient? Yes  SHORT TERM GOALS: Target date: 03/01/23  Patient will be independent with her initial HEP.  Baseline: Goal status: INITIAL  2.  Patient will be able to complete her daily activities without her familiar pain exceeding 8/10. Baseline:  Goal status: INITIAL  3.  Patient will be able to walk for at least 30 minutes without being limited by her familiar symptoms.  Baseline:  Goal status: INITIAL  LONG TERM GOALS: Target date: 03/22/23  Patient will be independent with her advanced HEP.  Baseline:  Goal status: INITIAL  2.  Patient will be able to complete her daily activities without her familiar pain exceeding 6/10.  Baseline:  Goal status: INITIAL  3.  Patient will be able to walk for at least 45 minutes without being limited by her familiar symptoms.  Baseline:  Goal status: INITIAL  4.  Patient will  improve her left hip flexor strength to at least 4+/5 for improved lower extremity strength. Baseline:  Goal status: INITIAL  PLAN:  PT FREQUENCY: 1-2x/week  PT DURATION: 6 weeks  PLANNED INTERVENTIONS: Therapeutic exercises, Therapeutic activity, Neuromuscular re-education, Balance training, Patient/Family education, Self Care, Joint mobilization, Electrical stimulation, Cryotherapy, Moist heat, Manual therapy, and Re-evaluation.  PLAN FOR NEXT SESSION: NuStep, lumbar strengthening and stabilization, and modalities as needed   Granville Lewis, PT 02/23/2023, 2:34 PM

## 2023-03-01 ENCOUNTER — Encounter: Payer: Self-pay | Admitting: Physical Therapy

## 2023-03-01 ENCOUNTER — Ambulatory Visit: Payer: Commercial Managed Care - PPO | Admitting: Physical Therapy

## 2023-03-01 DIAGNOSIS — M5442 Lumbago with sciatica, left side: Secondary | ICD-10-CM | POA: Diagnosis not present

## 2023-03-01 DIAGNOSIS — G8929 Other chronic pain: Secondary | ICD-10-CM

## 2023-03-01 NOTE — Therapy (Signed)
OUTPATIENT PHYSICAL THERAPY THORACOLUMBAR TREATMENT   Patient Name: Katrina Pineda MRN: [id number] DOB:Sep 12, 1985, 37 y.o., female Today's Date: 03/01/2023  END OF SESSION:  PT End of Session - 03/01/23 0855     Visit Number 4    Number of Visits 6    Date for PT Re-Evaluation 04/23/23    PT Start Time 0855    PT Stop Time 0930    PT Time Calculation (min) 35 min    Activity Tolerance Patient tolerated treatment well    Behavior During Therapy [facility] for tasks assessed/performed            Past Medical History:  Diagnosis Date   Anxiety    Depression    Heart murmur    Past Surgical History:  Procedure Laterality Date   CESAREAN SECTION  05/18/2022   TUBAL LIGATION     Patient Active Problem List   Diagnosis Date Noted   RhD negative 02/08/2023   BMI 45.0-49.9, adult (HCC) 02/08/2023   Class 3 severe obesity due to excess calories without serious comorbidity with body mass index (BMI) of 45.0 to 49.9 in adult [facility]) 02/08/2023   Prediabetes 02/08/2023   REFERRING PROVIDER: Gabriel Earing, FNP   REFERRING DIAG: Chronic bilateral low back pain with bilateral sciatica   Rationale for Evaluation and Treatment: Rehabilitation  THERAPY DIAG:  Chronic bilateral low back pain with bilateral sciatica  ONSET DATE: 8 years ago   SUBJECTIVE:                                                                                                                                                                                           SUBJECTIVE STATEMENT: Reports a pinching sensation this morning but has been painting in her home the last few days. Reports she was going up and down the ladder with doing the trim work. Had much more pain over night.  PERTINENT HISTORY:  Obesity  PAIN:  Are you having pain? Yes: NPRS scale: 2-3/10 Pain location: low back and both legs (primarily one leg or the other, but rarely both)  Pain description: Pinch Aggravating factors: standing (about  20 minutes,)walking (less than 20 minutes), certain positions sitting, and motion   Relieving factors: medication, stretching, and Icy Hot  PRECAUTIONS: None  WEIGHT BEARING RESTRICTIONS: No  FALLS:  Has patient fallen in last 6 months? 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 L5 pars defects with 14 mm anterolisthesis of L5 upon S1. 2. L5-S1 disc space narrowing.   SENSATION: Patient reports no numbness or tingling currently   PALPATION: TTP: right lumbar paraspinals, and spinous process of L3-5  LUMBAR JOINT MOBILITY:  WFL with L3-5 reproducing her familiar pain  LUMBAR ROM:   AROM eval  Flexion 68  Extension 38; repeated extension (x10 reps) aggravated familiar pain   Right lateral flexion WFL; "pinch" in low back and right hip   Left lateral flexion WFL  Right rotation WFL; "pinch" in low back and right hip   Left rotation WFL    (Blank rows = not tested)  LOWER EXTREMITY ROM: WFL for activities assessed  LOWER EXTREMITY MMT:    MMT Right eval Left eval  Hip flexion 4+/5 4/5  Hip extension    Hip abduction    Hip adduction    Hip internal rotation    Hip external rotation    Knee flexion 4/5 4/5  Knee extension 4/5 4/5  Ankle dorsiflexion 4+/5 4+/5  Ankle plantarflexion    Ankle inversion    Ankle eversion     (Blank rows = not tested)  GAIT: Assistive device utilized: None Level of assistance: Complete Independence Comments: no significant gait deviations observed  TODAY'S TREATMENT:                                                                                                                               DATE:   03/01/23 EXERCISE LOG  Exercise Repetitions and Resistance Comments  Nustep  L3 x 10 minutes   Resisted pull down  Blue XTS x30 reps   Multifidus press out  Green t-band x 2 minutes each    Modified bird dog  X8 reps for each section   Sink stretch 3x20 sec   Sit to stand with abd Red theraband x15 reps   LTR X5 reps 5 sec Greater pain with L rotation  Seated sciatic glide X10 reps 5 sec RLE   Standing hip ABD 2 minutes with 2 sec pause Alternating LE   Blank cell = exercise not performed today   PATIENT EDUCATION:  Education details: Seated sciatic glide  Person educated: Patient Education method: Programmer, multimedia, Demonstration, and Verbal cues Education comprehension: verbalized understanding and returned demonstration  HOME EXERCISE PROGRAM:  ASSESSMENT:  CLINICAL IMPRESSION: Patient presented in clinic with reports of mild pinch felt after all the activity this weekend with twisting and reaching. Patient progressed through stabilization and stretching with pinch felt with LTR. Patient also instructed with sciatic glides in sitting for home as she reports a pain through R buttock to knee. Patient required mod multimodal cueing to ensure proper technique throughout therex especially for stabilization exercises.  OBJECTIVE IMPAIRMENTS: decreased  activity tolerance, decreased mobility, difficulty walking, decreased strength, and pain.   ACTIVITY LIMITATIONS: lifting, standing, locomotion level, and caring for others  PARTICIPATION LIMITATIONS: meal prep, cleaning, laundry, shopping, community activity, and yard work  PERSONAL FACTORS: Profession, Time since onset of injury/illness/exacerbation, and 1 comorbidity: obesity  are also affecting patient's functional outcome.   REHAB POTENTIAL: Fair    CLINICAL DECISION MAKING: Evolving/moderate complexity  EVALUATION  COMPLEXITY: Moderate  GOALS: Goals reviewed with patient? Yes  SHORT TERM GOALS: Target date: 03/01/23  Patient will be independent with her initial HEP.  Baseline: Goal status: MET/ does same stretches from PT  2.  Patient will be able to complete her daily activities without her familiar pain exceeding 8/10. Baseline:  Goal status: IN PROGRESS  3.  Patient will be able to walk for at least 30 minutes without being limited by her familiar symptoms.  Baseline:  Goal status: IN PROGRESS  LONG TERM GOALS: Target date: 03/22/23  Patient will be independent with her advanced HEP.  Baseline:  Goal status: IN PROGRESS  2.  Patient will be able to complete her daily activities without her familiar pain exceeding 6/10.  Baseline:  Goal status: IN PROGRESS  3.  Patient will be able to walk for at least 45 minutes without being limited by her familiar symptoms.  Baseline:  Goal status: IN PROGRESS  4.  Patient will improve her left hip flexor strength to at least 4+/5 for improved lower extremity strength. Baseline:  Goal status: IN PROGRESS  PLAN:  PT FREQUENCY: 1-2x/week  PT DURATION: 6 weeks  PLANNED INTERVENTIONS: Therapeutic exercises, Therapeutic activity, Neuromuscular re-education, Balance training, Patient/Family education, Self Care, Joint mobilization, Electrical stimulation, Cryotherapy, Moist heat, Manual therapy, and Re-evaluation.  PLAN FOR NEXT SESSION: NuStep, lumbar strengthening and stabilization, and modalities as needed  Marvell Fuller, PTA 03/01/2023, 9:47 AM

## 2023-03-08 ENCOUNTER — Ambulatory Visit: Payer: Commercial Managed Care - PPO

## 2023-03-08 DIAGNOSIS — G8929 Other chronic pain: Secondary | ICD-10-CM

## 2023-03-08 DIAGNOSIS — M5442 Lumbago with sciatica, left side: Secondary | ICD-10-CM | POA: Diagnosis not present

## 2023-03-08 NOTE — Therapy (Addendum)
 OUTPATIENT PHYSICAL THERAPY THORACOLUMBAR TREATMENT   Patient Name: Katrina Pineda MRN: [id number] DOB:10-09-1985, 37 y.o., female Today's Date: 03/08/2023  END OF SESSION:  PT End of Session - 03/08/23 0849     Visit Number 5    Number of Visits 6    Date for PT Re-Evaluation 04/23/23    PT Start Time 0845    PT Stop Time 0929    PT Time Calculation (min) 44 min    Activity Tolerance Patient tolerated treatment well    Behavior During Therapy [facility] for tasks assessed/performed             Past Medical History:  Diagnosis Date   Anxiety    Depression    Heart murmur    Past Surgical History:  Procedure Laterality Date   CESAREAN SECTION  05/18/2022   TUBAL LIGATION     Patient Active Problem List   Diagnosis Date Noted   RhD negative 02/08/2023   BMI 45.0-49.9, adult (HCC) 02/08/2023   Class 3 severe obesity due to excess calories without serious comorbidity with body mass index (BMI) of 45.0 to 49.9 in adult [facility]) 02/08/2023   Prediabetes 02/08/2023   REFERRING PROVIDER: Albertha Huger, FNP   REFERRING DIAG: Chronic bilateral low back pain with bilateral sciatica   Rationale for Evaluation and Treatment: Rehabilitation  THERAPY DIAG:  Chronic bilateral low back pain with bilateral sciatica  ONSET DATE: 8 years ago   SUBJECTIVE:                                                                                                                                                                                           SUBJECTIVE STATEMENT: Patient reports that she is having a pinching feeling in her right low back today. She notes that she normally feels "fine" after leaving therapy, but her pain returns after getting home and doing things around the house.   PERTINENT HISTORY:  Obesity  PAIN:  Are you having pain? Yes: NPRS scale: 3-4/10 Pain location: low back and both legs (primarily one leg or the other, but rarely both)  Pain description:  Pinch Aggravating factors: standing (about 20 minutes,)walking (less than 20 minutes), certain positions sitting, and motion   Relieving factors: medication, stretching, and Icy Hot  PRECAUTIONS: None  WEIGHT BEARING RESTRICTIONS: No  FALLS:  Has patient fallen in last 6 months? 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 L5 pars defects with 14 mm anterolisthesis of L5 upon S1. 2. L5-S1 disc space narrowing.   SENSATION: Patient reports no numbness or tingling currently   PALPATION: TTP: right lumbar paraspinals, and spinous process of L3-5  LUMBAR JOINT MOBILITY:  WFL with L3-5 reproducing her familiar pain  LUMBAR ROM:   AROM eval  Flexion 68  Extension 38; repeated extension (x10 reps) aggravated familiar pain   Right lateral flexion WFL; "pinch" in low back and right hip   Left lateral flexion WFL  Right rotation WFL; "pinch" in low back and right hip   Left rotation WFL    (Blank rows = not tested)  LOWER EXTREMITY ROM: WFL for activities assessed  LOWER EXTREMITY MMT:    MMT Right eval Left eval  Hip flexion 4+/5 4/5  Hip extension    Hip abduction    Hip adduction    Hip internal rotation    Hip external rotation    Knee flexion 4/5 4/5  Knee extension 4/5 4/5  Ankle dorsiflexion 4+/5 4+/5  Ankle plantarflexion    Ankle inversion    Ankle eversion     (Blank rows = not tested)  GAIT: Assistive device utilized: None Level of assistance: Complete Independence Comments: no significant gait deviations  observed  TODAY'S TREATMENT:                                                                                                                              DATE:                                   03/08/23 EXERCISE LOG  Exercise Repetitions and Resistance Comments  Nustep  L4 x 18 minutes   Standing open books  2 minutes   Resisted pull down  Blue XTS x 2 minutes   Multifidus press out  Blue t-band x 2 minutes each    Standing hip hike  10 reps    Bent knee fall out  3 minutes  Alternating LE  Bridge w/ hip ADD isometric 15 reps    Hip ADD isometric  15 reps w/ 5 second hold   Ab bracing 10 reps     Blank cell = exercise not performed today      03/01/23 EXERCISE LOG  Exercise Repetitions and Resistance Comments  Nustep  L3 x 10 minutes   Resisted pull down  Blue XTS x30 reps   Multifidus press out  Green t-band x 2 minutes each    Modified bird dog  X8 reps for each section   Sink stretch 3x20 sec   Sit to stand with abd Red theraband x15 reps   LTR  X5 reps 5 sec Greater pain with L rotation  Seated sciatic glide X10 reps 5 sec RLE   Standing hip ABD 2 minutes with 2 sec pause Alternating LE   Blank cell = exercise not performed today   PATIENT EDUCATION:  Education details: Abdomen bracing and core stabilization Person educated: Patient Education method: Programmer, multimedia, Demonstration, and Verbal cues Education comprehension: verbalized understanding and returned demonstration  HOME EXERCISE PROGRAM:  ASSESSMENT:  CLINICAL IMPRESSION: Patient was progressed with multiple new and familiar interventions for improved lumbar strength and stability. Bridging was attempted, but she was unable to complete this intervention due to her familiar symptoms. However, when isometric hip adduction with introduced during a bridge this significantly reduced her familiar symptoms.  She was educated on utilizing abductor bracing during activities such as lifting to reduce her familiar pain with  these interventions. She reported feeling better upon the conclusion of treatment. She continues to require skilled physical therapy to address her remaining impairments to return to her prior level of function.   PHYSICAL THERAPY DISCHARGE SUMMARY  Visits from Start of Care: 5  Current functional level related to goals / functional outcomes: Patient was unable to meet her goals for physical therapy.    Remaining deficits: Pain and muscular strength   Education / Equipment: HEP   Patient agrees to discharge. Patient goals were partially met. Patient is being discharged due to not returning since the last visit.  Glendora Landsman, PT, DPT    OBJECTIVE IMPAIRMENTS: decreased activity tolerance, decreased mobility, difficulty walking, decreased strength, and pain.   ACTIVITY LIMITATIONS: lifting, standing, locomotion level, and caring for others  PARTICIPATION LIMITATIONS: meal prep, cleaning, laundry, shopping, community activity, and yard work  PERSONAL FACTORS: Profession, Time since onset of injury/illness/exacerbation, and 1 comorbidity: obesity are also affecting patient's functional outcome.   REHAB POTENTIAL: Fair    CLINICAL DECISION MAKING: Evolving/moderate complexity  EVALUATION COMPLEXITY: Moderate  GOALS: Goals reviewed with patient? Yes  SHORT TERM GOALS: Target date: 03/01/23  Patient will be independent with her initial HEP.  Baseline: Goal status: MET/ does same stretches from PT  2.  Patient will be able to complete her daily activities without her familiar pain exceeding 8/10. Baseline:  Goal status: IN PROGRESS  3.  Patient will be able to walk for at least 30 minutes without being limited by her familiar symptoms.  Baseline:  Goal status: IN PROGRESS  LONG TERM GOALS: Target date: 03/22/23  Patient will be independent with her advanced HEP.  Baseline:  Goal status: IN PROGRESS  2.  Patient will be able to complete her daily activities without her  familiar pain exceeding 6/10.  Baseline:  Goal status: IN PROGRESS  3.  Patient will be able to walk for at least 45 minutes without being limited by her familiar symptoms.  Baseline:  Goal status: IN PROGRESS  4.  Patient will improve her left hip flexor strength to at least 4+/5 for improved lower extremity strength. Baseline:  Goal status: IN PROGRESS  PLAN:  PT FREQUENCY: 1-2x/week  PT DURATION: 6 weeks  PLANNED INTERVENTIONS: Therapeutic exercises, Therapeutic activity, Neuromuscular re-education, Balance training, Patient/Family education, Self Care, Joint mobilization, Electrical stimulation, Cryotherapy, Moist heat, Manual therapy, and Re-evaluation.  PLAN FOR NEXT SESSION: NuStep, lumbar strengthening and stabilization, and modalities as needed  Lane Pinon, PT 03/08/2023, 4:03 PM

## 2023-03-15 ENCOUNTER — Ambulatory Visit: Payer: Commercial Managed Care - PPO | Admitting: Family Medicine

## 2023-03-15 ENCOUNTER — Ambulatory Visit: Payer: Commercial Managed Care - PPO

## 2023-04-12 ENCOUNTER — Ambulatory Visit: Payer: Commercial Managed Care - PPO | Admitting: Nutrition

## 2023-04-22 ENCOUNTER — Telehealth (INDEPENDENT_AMBULATORY_CARE_PROVIDER_SITE_OTHER): Payer: Commercial Managed Care - PPO | Admitting: Family

## 2023-04-22 ENCOUNTER — Encounter: Payer: Self-pay | Admitting: Family

## 2023-04-22 DIAGNOSIS — Z20818 Contact with and (suspected) exposure to other bacterial communicable diseases: Secondary | ICD-10-CM

## 2023-04-22 DIAGNOSIS — J029 Acute pharyngitis, unspecified: Secondary | ICD-10-CM | POA: Diagnosis not present

## 2023-04-22 MED ORDER — AMOXICILLIN 500 MG PO CAPS: 500.0000 mg | ORAL_CAPSULE | Freq: Two times a day (BID) | ORAL | 0 refills | Status: AC

## 2023-04-22 NOTE — Progress Notes (Signed)
Virtual Visit Consent   Avo Voci, you are scheduled for a virtual visit with a [facility] provider today. Just as with appointments in the office, your consent must be obtained to participate. Your consent will be active for this visit and any virtual visit you may have with one of our providers in the next 365 days. If you have a MyChart account, a copy of this consent can be sent to you electronically.  As this is a virtual visit, video technology does not allow for your provider to perform a traditional examination. This may limit your provider's ability to fully assess your condition. If your provider identifies any concerns that need to be evaluated in person or the need to arrange testing (such as labs, EKG, etc.), we will make arrangements to do so. Although advances in technology are sophisticated, we cannot ensure that it will always work on either your end or our end. If the connection with a video visit is poor, the visit may have to be switched to a telephone visit. With either a video or telephone visit, we are not always able to ensure that we have a secure connection.  By engaging in this virtual visit, you consent to the provision of healthcare and authorize for your insurance to be billed (if applicable) for the services provided during this visit. Depending on your insurance coverage, you may receive a charge related to this service.  I need to obtain your verbal consent now. Are you willing to proceed with your visit today? Katrina Pineda has provided verbal consent on 04/22/2023 for a virtual visit (video or telephone). Jannifer Rodney, FNP  Date: 04/22/2023 1:50 PM  Virtual Visit via Video Note   I, Jannifer Rodney, connected with  Katrina Pineda  ([id number], 12/27/1985) on 04/22/23 at  5:00 PM EDT by a video-enabled telemedicine application and verified that I am speaking with the correct person using two identifiers.  Location: Patient: Virtual Visit Location Patient: Other:  car Provider: Virtual Visit Location Provider: Home Office   I discussed the limitations of evaluation and management by telemedicine and the availability of in person appointments. The patient expressed understanding and agreed to proceed.    History of Present Illness: Katrina Pineda is a 37 y.o. who identifies as a female who was assigned female at birth, and is being seen today for sore throat that started Sunday. Reports her coworker just tested positive for strep throat.  HPI: Sore Throat  This is a new problem. The current episode started in the past 7 days. The problem has been gradually worsening. Maximum temperature: feels warm. The pain is at a severity of 7/10. The pain is moderate. Associated symptoms include congestion, ear pain, headaches, swollen glands and trouble swallowing. Pertinent negatives include no coughing, ear discharge or shortness of breath. She has had exposure to strep. She has tried acetaminophen and NSAIDs for the symptoms. The treatment provided mild relief.    Problems:  Patient Active Problem List   Diagnosis Date Noted   RhD negative 02/08/2023   BMI 45.0-49.9, adult (HCC) 02/08/2023   Class 3 severe obesity due to excess calories without serious comorbidity with body mass index (BMI) of 45.0 to 49.9 in adult [facility]) 02/08/2023   Prediabetes 02/08/2023    Allergies: No Known Allergies Medications:  Current Outpatient Medications:    amoxicillin (AMOXIL) 500 MG capsule, Take 1 capsule (500 mg total) by mouth 2 (two) times daily for 10 days., Disp: 20 capsule, Rfl: 0   etonogestrel (  NEXPLANON) 68 MG IMPL implant, 68 mg by Subdermal route once., Disp: , Rfl:   Observations/Objective: Patient is well-developed, well-nourished in no acute distress.  Resting comfortably  at home.  Head is normocephalic, atraumatic.  No labored breathing.  Speech is clear and coherent with logical content.  Patient is alert and oriented at baseline.  Tonsils erythemas and  swelling  Assessment and Plan: 1. Exposure to strep throat - amoxicillin (AMOXIL) 500 MG capsule; Take 1 capsule (500 mg total) by mouth 2 (two) times daily for 10 days.  Dispense: 20 capsule; Refill: 0  2. Acute pharyngitis, unspecified etiology - amoxicillin (AMOXIL) 500 MG capsule; Take 1 capsule (500 mg total) by mouth 2 (two) times daily for 10 days.  Dispense: 20 capsule; Refill: 0  Start Amoxicillin - Take meds as prescribed - Use a cool mist humidifier  -Use saline nose sprays frequently -Force fluids -For any cough or congestion  Use plain Mucinex- regular strength or max strength is fine -For fever or aces or pains- take tylenol or ibuprofen. -Throat lozenges if help -New toothbrush in 3 days Follow up if symptoms worsen or do not improve   Follow Up Instructions: I discussed the assessment and treatment plan with the patient. The patient was provided an opportunity to ask questions and all were answered. The patient agreed with the plan and demonstrated an understanding of the instructions.  A copy of instructions were sent to the patient via MyChart unless otherwise noted below.     The patient was advised to call back or seek an in-person evaluation if the symptoms worsen or if the condition fails to improve as anticipated.  Time:  I spent 8 minutes with the patient via telehealth technology discussing the above problems/concerns.    Jannifer Rodney, FNP

## 2023-06-07 ENCOUNTER — Encounter: Payer: Commercial Managed Care - PPO | Attending: Family Medicine | Admitting: Nutrition

## 2023-06-07 VITALS — Ht 65.0 in | Wt 259.0 lb

## 2023-06-07 DIAGNOSIS — E66813 Obesity, class 3: Secondary | ICD-10-CM | POA: Insufficient documentation

## 2023-06-07 DIAGNOSIS — Z6841 Body Mass Index (BMI) 40.0 and over, adult: Secondary | ICD-10-CM | POA: Insufficient documentation

## 2023-06-07 DIAGNOSIS — R7303 Prediabetes: Secondary | ICD-10-CM | POA: Insufficient documentation

## 2023-06-07 NOTE — Patient Instructions (Addendum)
  Goals Eat three meals per day Choose better snacks choices of vegetables Talk to provider about medications and referral to therapist. Work on meal planning and meal prepping. Ask PT about water therapy

## 2023-06-07 NOTE — Progress Notes (Unsigned)
Medical Nutrition Therapy  Appointment Start time:  1300  Appointment End time:  1400  Primary concerns today: Pre Dm and Obesity  Referral diagnosis: E66.9, R73.03 Preferred learning style: No Preference  Learning readiness: Ready  NUTRITION ASSESSMENT  37 yr old wfemale referred for prediabetes and obesity. Wants to lose weight and reverse her pre diabetes.  She is willing to work with Lifestyle Medicine and focus on more whole plant based foods and the 6 pillars of health to improve her overall health and reverse diabetes. Clinical Medical Hx:  Past Medical History:  Diagnosis Date   Anxiety    Depression    Heart murmur     Medications:  Current Outpatient Medications on File Prior to Visit  Medication Sig Dispense Refill   etonogestrel (NEXPLANON) 68 MG IMPL implant 68 mg by Subdermal route once.     No current facility-administered medications on file prior to visit.    Labs:  Lab Results  Component Value Date   HGBA1C 5.8 (H) 02/08/2023    Notable Signs/Symptoms: None  Lifestyle & Dietary Hx LIves with boyfriend and 2 kids. Works outside of home.  Estimated daily fluid intake: 40 oz Supplements:  Sleep:  Stress / self-care: some stress Current average weekly physical activity: ADL  24-Hr Dietary Recall Eats 2-3 meals per day. Skips breakfast often, May snack at times.  Estimated Energy Needs Calories: 1200 Carbohydrate: 135g Protein: 90g Fat: 33g   NUTRITION DIAGNOSIS  NI-1.7 Predicted excessive energy intake As related to High calorie processed foods diet.  As evidenced by Pre Dm A1C 5.8, Obesity BMI 43  .   NUTRITION INTERVENTION  Nutrition education (E-1) on the following topics:  Nutrition and Pre Diabetes education provided on My Plate, CHO counting, meal planning, portion sizes, timing of meals, avoiding snacks between meals unless having a low blood sugar, target ranges for A1C and blood sugars, signs/symptoms and treatment of  hyper/hypoglycemia, monitoring blood sugars, taking medications as prescribed, benefits of exercising 30 minutes per day and prevention of complications of DM. Lifestyle Medicine  - Whole Food, Plant Predominant Nutrition is highly recommended: Eat Plenty of vegetables, Mushrooms, fruits, Legumes, Whole Grains, Nuts, seeds in lieu of processed meats, processed snacks/pastries red meat, poultry, eggs.    -It is better to avoid simple carbohydrates including: Cakes, Sweet Desserts, Ice Cream, Soda (diet and regular), Sweet Tea, Candies, Chips, Cookies, Store Bought Juices, Alcohol in Excess of  1-2 drinks a day, Lemonade,  Artificial Sweeteners, Doughnuts, Coffee Creamers, "Sugar-free" Products, etc, etc.  This is not a complete list.....  Exercise: If you are able: 30 -60 minutes a day ,4 days a week, or 150 minutes a week.  The longer the better.  Combine stretch, strength, and aerobic activities.  If you were told in the past that you have high risk for cardiovascular diseases, you may seek evaluation by your heart doctor prior to initiating moderate to intense exercise programs.   Handouts Provided Include  Lifestyle Medicine handouts  Learning Style & Readiness for Change Teaching method utilized: Visual & Auditory  Demonstrated degree of understanding via: Teach Back  Barriers to learning/adherence to lifestyle change: None  Goals Established by Pt Goals Eat three meals per day Choose better snacks choices of vegetables Talk to provider about medications and referral to therapist. Work on meal planning and meal prepping. Ask PT about water therapy   MONITORING & EVALUATION Dietary intake, weekly physical activity, and weight in 1 month.  Recommend to check a  lipid profile to assess cholesterol levels due to risk factors.  Next Steps  Patient is to work on meal planning and portion control and preparing foods at home.Marland Kitchen

## 2023-06-09 ENCOUNTER — Encounter: Payer: Self-pay | Admitting: Nutrition

## 2023-06-10 ENCOUNTER — Ambulatory Visit: Payer: Commercial Managed Care - PPO | Admitting: Family Medicine

## 2023-06-10 ENCOUNTER — Encounter: Payer: Self-pay | Admitting: Family Medicine

## 2023-06-10 VITALS — BP 124/77 | HR 64 | Temp 97.9°F | Ht 65.0 in | Wt 257.4 lb

## 2023-06-10 DIAGNOSIS — F339 Major depressive disorder, recurrent, unspecified: Secondary | ICD-10-CM | POA: Diagnosis not present

## 2023-06-10 DIAGNOSIS — R7303 Prediabetes: Secondary | ICD-10-CM

## 2023-06-10 DIAGNOSIS — J029 Acute pharyngitis, unspecified: Secondary | ICD-10-CM

## 2023-06-10 DIAGNOSIS — H66003 Acute suppurative otitis media without spontaneous rupture of ear drum, bilateral: Secondary | ICD-10-CM

## 2023-06-10 DIAGNOSIS — F411 Generalized anxiety disorder: Secondary | ICD-10-CM | POA: Diagnosis not present

## 2023-06-10 DIAGNOSIS — R011 Cardiac murmur, unspecified: Secondary | ICD-10-CM | POA: Diagnosis not present

## 2023-06-10 LAB — BAYER DCA HB A1C WAIVED: HB A1C (BAYER DCA - WAIVED): 5.6 % (ref 4.8–5.6)

## 2023-06-10 LAB — CULTURE, GROUP A STREP

## 2023-06-10 LAB — RAPID STREP SCREEN (MED CTR MEBANE ONLY): Strep Gp A Ag, IA W/Reflex: NEGATIVE

## 2023-06-10 MED ORDER — SEMAGLUTIDE-WEIGHT MANAGEMENT 1 MG/0.5ML ~~LOC~~ SOAJ: 1.0000 mg | SUBCUTANEOUS | 0 refills | Status: AC

## 2023-06-10 MED ORDER — SEMAGLUTIDE-WEIGHT MANAGEMENT 1.7 MG/0.75ML ~~LOC~~ SOAJ: 1.7000 mg | SUBCUTANEOUS | 0 refills | Status: AC

## 2023-06-10 MED ORDER — AMOXICILLIN 875 MG PO TABS: 875.0000 mg | ORAL_TABLET | Freq: Two times a day (BID) | ORAL | 0 refills | Status: AC

## 2023-06-10 MED ORDER — SEMAGLUTIDE-WEIGHT MANAGEMENT 0.25 MG/0.5ML ~~LOC~~ SOAJ: 0.2500 mg | SUBCUTANEOUS | 0 refills | Status: AC

## 2023-06-10 MED ORDER — SEMAGLUTIDE-WEIGHT MANAGEMENT 2.4 MG/0.75ML ~~LOC~~ SOAJ: 2.4000 mg | SUBCUTANEOUS | 6 refills | Status: AC

## 2023-06-10 MED ORDER — SEMAGLUTIDE-WEIGHT MANAGEMENT 0.5 MG/0.5ML ~~LOC~~ SOAJ: 0.5000 mg | SUBCUTANEOUS | 0 refills | Status: AC

## 2023-06-10 NOTE — Progress Notes (Signed)
Established Patient Office Visit  Subjective   Patient ID: Katrina Pineda, female    DOB: Aug 04, 1986  Age: 37 y.o. MRN: [id number]  Chief Complaint  Patient presents with   Depression   Obesity   Sore Throat    Sore Throat  This is a new problem. The current episode started in the past 7 days. The problem has been unchanged. The pain is worse on the left side. There has been no fever. The pain is moderate. Associated symptoms include congestion, coughing, ear pain and swollen glands. Pertinent negatives include no drooling, ear discharge or trouble swallowing. She has had exposure to strep. She has tried gargles (theraflu) for the symptoms. The treatment provided mild relief.   Depression/anxiety She has restarted on zoloft 1 week ago. This was orginially prescribed by her Ob. She is having a lot of anxiety  and feeling overwhelm. Causing relationship problems now. She is interest in counseling. The last week has been really rough for her. Denies significant life stresses currently.   Obesity She has been trying to lose weight. She has lost 8 lbs on her own for the last 4 months. She has been cutting back on snacks and has increased her water intake. Increased activity by trying to walk throughout the day. She previously was on phentermine 5-6 years ago and did well on this without side effects.  Reports her insurance covers [id number].  She does have a history of a heart murmur since childhood. Saw cardiology as a child. Denies symptoms. Hx of HLD and prediabetes.      06/10/2023   11:15 AM 02/08/2023   12:02 PM 12/31/2022   10:38 AM  Depression screen PHQ 2/9  Decreased Interest 1 1 0  Down, Depressed, Hopeless 3 0 1  PHQ - 2 Score 4 1 1   Altered sleeping 1 0 0  Tired, decreased energy 1 2 2   Change in appetite 1 0 0  Feeling bad or failure about yourself  3 2 1   Trouble concentrating 1 0 0  Moving slowly or fidgety/restless 1 0 0  Suicidal thoughts 0 0 0  PHQ-9 Score 12 5 4    Difficult doing work/chores Somewhat difficult Not difficult at all Not difficult at all      06/10/2023   11:14 AM 02/08/2023   12:02 PM 12/31/2022   10:39 AM 02/09/2022    2:20 PM  GAD 7 : Generalized Anxiety Score  Nervous, Anxious, on Edge 2 0 1 0  Control/stop worrying 3 0 1 0  Worry too much - different things 2 0 1 0  Trouble relaxing 1 0 1 0  Restless 0 0 0 0  Easily annoyed or irritable 2 0 2 0  Afraid - awful might happen 1 0 0 0  Total GAD 7 Score 11 0 6 0  Anxiety Difficulty Somewhat difficult Not difficult at all Not difficult at all Not difficult at all       ROS As per HPI.     Objective:     BP 124/77   Pulse 64   Temp 97.9 F (36.6 C) (Temporal)   Ht 5\' 5"  (1.651 m)   Wt 257 lb 6 oz (116.7 kg)   SpO2 96%   BMI 42.83 kg/m  Wt Readings from Last 3 Encounters:  06/10/23 257 lb 6 oz (116.7 kg)  06/07/23 259 lb (117.5 kg)  02/08/23 265 lb 6.4 oz (120.4 kg)      Physical Exam Vitals and nursing note  reviewed.  Constitutional:      General: She is not in acute distress.    Appearance: Normal appearance. She is obese. She is not ill-appearing, toxic-appearing or diaphoretic.  HENT:     Head: Normocephalic and atraumatic.     Right Ear: Ear canal normal. No drainage or swelling. A middle ear effusion is present. Tympanic membrane is erythematous. Tympanic membrane is not retracted or bulging.     Left Ear: Ear canal normal. No drainage or swelling. A middle ear effusion is present. Tympanic membrane is erythematous. Tympanic membrane is not retracted or bulging.     Nose: Congestion present.     Right Sinus: No maxillary sinus tenderness or frontal sinus tenderness.     Left Sinus: No frontal sinus tenderness.     Mouth/Throat:     Lips: Pink.     Mouth: Mucous membranes are moist.     Pharynx: Oropharynx is clear. Posterior oropharyngeal erythema present. No pharyngeal swelling, oropharyngeal exudate or uvula swelling.     Tonsils: No tonsillar  exudate or tonsillar abscesses. 1+ on the right. 2+ on the left.  Eyes:     General:        Right eye: No discharge.        Left eye: No discharge.     Extraocular Movements: Extraocular movements intact.     Conjunctiva/sclera: Conjunctivae normal.  Cardiovascular:     Rate and Rhythm: Normal rate and regular rhythm.     Pulses: Normal pulses.     Heart sounds: Murmur heard.     Systolic murmur is present with a grade of 1/6.     No diastolic murmur is present.  Pulmonary:     Effort: Pulmonary effort is normal. No respiratory distress.     Breath sounds: Normal breath sounds.  Abdominal:     General: Bowel sounds are normal. There is no distension.     Palpations: Abdomen is soft. There is no mass.     Tenderness: There is no abdominal tenderness. There is no guarding or rebound.  Musculoskeletal:     Cervical back: Neck supple. No tenderness.     Right lower leg: No edema.     Left lower leg: No edema.  Lymphadenopathy:     Cervical: Cervical adenopathy present.  Skin:    General: Skin is warm and dry.  Neurological:     General: No focal deficit present.     Mental Status: She is alert and oriented to person, place, and time.  Psychiatric:        Mood and Affect: Mood normal.        Behavior: Behavior normal.      No results found for any visits on 06/10/23.    The ASCVD Risk score (Arnett DK, et al., 2019) failed to calculate for the following reasons:   The 2019 ASCVD risk score is only valid for ages 53 to 58    Assessment & Plan:   Katrina Pineda was seen today for depression, obesity and sore throat.  Diagnoses and all orders for this visit:  Depression, recurrent (HCC) Generalized anxiety disorder Continue zoloft. Patient verbally consented to [facility] services about presenting concerns and psychiatric consultation as appropriate.  The services will be billed as appropriate for the patient -     Ambulatory referral to Integrated  Behavioral Health  Morbid obesity (HCC) Diet, exercise weight. She has lost some weight on her own. Continue follow up with nutrition. Patient's BMI  is >30 mg/m2.  Patient's current BMI is Body mass index is 42.83 kg/m.Marland Kitchen  Patient is currently enrolled in a healthy eating plan along with encouraged exercise. Patient has contraindications to phentermine, Contrave & Qsymia (contains phentermine) due to uncontrolled anxiety, heart murmur. Patient does not have a personal or family history of medullary thyroid carcinoma (MTC) or Multiple Endocrine Neoplasia syndrome type 2 (MEN 2). -     CBC with Differential/Platelet -     CMP14+EGFR -     Lipid panel -     TSH -     Semaglutide-Weight Management 0.25 MG/0.5ML SOAJ; Inject 0.25 mg into the skin once a week for 28 days. -     Semaglutide-Weight Management 0.5 MG/0.5ML SOAJ; Inject 0.5 mg into the skin once a week for 28 days. -     Semaglutide-Weight Management 1 MG/0.5ML SOAJ; Inject 1 mg into the skin once a week for 28 days. -     Semaglutide-Weight Management 1.7 MG/0.75ML SOAJ; Inject 1.7 mg into the skin once a week for 28 days. -     Semaglutide-Weight Management 2.4 MG/0.75ML SOAJ; Inject 2.4 mg into the skin once a week for 28 days.  Prediabetes Diet, exercise, weight loss.  -     Bayer DCA Hb A1c Waived  Sore throat Negative rapid strep. Culture pending. Discussed viral etiology.  -     Rapid Strep Screen ([facility] ONLY)  Non-recurrent acute suppurative otitis media of both ears without spontaneous rupture of tympanic membranes Amoxicillin as below. Tylenol, advil prn for for pain, fever.  -     amoxicillin (AMOXIL) 875 MG tablet; Take 1 tablet (875 mg total) by mouth 2 (two) times daily for 10 days.  Heart murmur EKG with sinus rhythm today. Asymptomatic.  -     EKG 12-Lead   Return in about 4 weeks (around 07/08/2023) for medication follow up.   The patient indicates understanding of these issues and agrees with the  plan.  Gabriel Earing, FNP

## 2023-06-11 LAB — CMP14+EGFR
ALT: 18 [IU]/L (ref 0–32)
AST: 15 [IU]/L (ref 0–40)
Albumin: 4.2 g/dL (ref 3.9–4.9)
Alkaline Phosphatase: 72 [IU]/L (ref 44–121)
BUN/Creatinine Ratio: 12 (ref 9–23)
BUN: 7 mg/dL (ref 6–20)
Bilirubin Total: 0.3 mg/dL (ref 0.0–1.2)
CO2: 21 mmol/L (ref 20–29)
Calcium: 9.3 mg/dL (ref 8.7–10.2)
Chloride: 103 mmol/L (ref 96–106)
Creatinine, Ser: 0.59 mg/dL (ref 0.57–1.00)
Globulin, Total: 2.6 g/dL (ref 1.5–4.5)
Glucose: 110 mg/dL — ABNORMAL HIGH (ref 70–99)
Potassium: 4.2 mmol/L (ref 3.5–5.2)
Sodium: 139 mmol/L (ref 134–144)
Total Protein: 6.8 g/dL (ref 6.0–8.5)
eGFR: 120 mL/min/{1.73_m2} (ref >59)

## 2023-06-11 LAB — LIPID PANEL
Chol/HDL Ratio: 4.3 {ratio} (ref 0.0–4.4)
Cholesterol, Total: 137 mg/dL (ref 100–199)
HDL: 32 mg/dL — ABNORMAL LOW (ref >39)
LDL Chol Calc (NIH): 90 mg/dL (ref 0–99)
Triglycerides: 78 mg/dL (ref 0–149)
VLDL Cholesterol Cal: 15 mg/dL (ref 5–40)

## 2023-06-11 LAB — CBC WITH DIFFERENTIAL/PLATELET
Basophils Absolute: 0 10*3/uL (ref 0.0–0.2)
Basos: 0 %
EOS (ABSOLUTE): 0.1 10*3/uL (ref 0.0–0.4)
Eos: 1 %
Hematocrit: 38.1 % (ref 34.0–46.6)
Hemoglobin: 12.8 g/dL (ref 11.1–15.9)
Immature Grans (Abs): 0.1 10*3/uL (ref 0.0–0.1)
Immature Granulocytes: 1 %
Lymphocytes Absolute: 2.5 10*3/uL (ref 0.7–3.1)
Lymphs: 32 %
MCH: 30 pg (ref 26.6–33.0)
MCHC: 33.6 g/dL (ref 31.5–35.7)
MCV: 89 fL (ref 79–97)
Monocytes Absolute: 0.5 10*3/uL (ref 0.1–0.9)
Monocytes: 7 %
Neutrophils Absolute: 4.6 10*3/uL (ref 1.4–7.0)
Neutrophils: 59 %
Platelets: 376 10*3/uL (ref 150–450)
RBC: 4.27 x10E6/uL (ref 3.77–5.28)
RDW: 12.5 % (ref 11.7–15.4)
WBC: 7.8 10*3/uL (ref 3.4–10.8)

## 2023-06-11 LAB — TSH: TSH: 2.21 u[IU]/mL (ref 0.450–4.500)

## 2023-06-14 ENCOUNTER — Telehealth: Payer: Self-pay | Admitting: Family Medicine

## 2023-06-15 ENCOUNTER — Telehealth: Payer: Self-pay

## 2023-06-15 ENCOUNTER — Ambulatory Visit: Payer: Commercial Managed Care - PPO | Admitting: Professional Counselor

## 2023-06-15 ENCOUNTER — Encounter: Payer: Self-pay | Admitting: Family Medicine

## 2023-06-15 DIAGNOSIS — F411 Generalized anxiety disorder: Secondary | ICD-10-CM

## 2023-06-15 NOTE — Telephone Encounter (Signed)
Katrina Pineda (Key: [id number]) Rx #: [id number] 0.25MG /0.5ML auto-injectors Form [employer] Electronic PA Form [phone] NCPDP) Created 5 days ago Sent to Plan 2 minutes ago Plan Response 2 minutes ago Submit Clinical Questions less than a minute ago Determination Wait for Determination Please wait for [employer] 2017 to return a determination.

## 2023-06-15 NOTE — Telephone Encounter (Signed)
Pharmacy Patient Advocate Encounter  Received notification from [employer] that Prior Authorization for [facility] 0.25MG /0.5ML auto-injectors has been APPROVED from 05/16/23 to 01/11/24   PA #/Case ID/Reference #: [id number]
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
## 2023-06-16 ENCOUNTER — Telehealth (INDEPENDENT_AMBULATORY_CARE_PROVIDER_SITE_OTHER): Payer: Commercial Managed Care - PPO | Admitting: Professional Counselor

## 2023-06-16 DIAGNOSIS — F411 Generalized anxiety disorder: Secondary | ICD-10-CM
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
## 2023-06-16 NOTE — Patient Instructions (Signed)
If your symptoms worsen or you have thoughts of suicide/homicide, PLEASE SEEK IMMEDIATE MEDICAL ATTENTION.  You may always call:   National Suicide Hotline: 988 or 800-273-8255 [place] Crisis Line: 336-832-9700 Crisis Recovery in [place]: 800-939-5911      These are available 24 hours a day, 7 days a week.  

## 2023-06-21 ENCOUNTER — Ambulatory Visit (INDEPENDENT_AMBULATORY_CARE_PROVIDER_SITE_OTHER): Payer: Commercial Managed Care - PPO | Admitting: Professional Counselor

## 2023-06-21 DIAGNOSIS — F411 Generalized anxiety disorder: Secondary | ICD-10-CM

## 2023-06-21 NOTE — BH Specialist Note (Unsigned)
[place] Virtual BH Telephone Follow-up  MRN: [id number] NAME: Katrina Pineda Date: 06/21/23  Start time: Start Time: 1030 End time: Stop Time: 1100 Total time: Total Time in Minutes (Visit): 30 Call number: Visit Number: 3- Third Visit  Reason for call today:  The patient, a 37 year old female, returned for a collaborative care follow-up, presenting with a mood similar to her previous session. She continues to feel overwhelmed and stressed, experiencing symptoms of depression and anxiety alongside ongoing marital conflict and uncertainty about the future of her relationship. However, she expressed a slight increase in hopefulness, acknowledging her efforts to be more active and engaged, which gives her some optimism, though she still struggles to fully envision positive outcomes.  We reviewed the recent psychiatric consultation findings, which support meeting bi-weekly and maintaining her current medication regimen. We also discussed the potential for a referral to a traditional therapist as a longer-term strategy for addressing her needs. The patient is open to exploring this option, and until a suitable therapist is identified, we will continue our weekly sessions. A follow-up is scheduled for next week to assess her progress and finalize plans for additional support.  PHQ-9 Scores:     06/15/2023   11:13 AM 06/10/2023   11:15 AM 02/08/2023   12:02 PM 12/31/2022   10:38 AM 02/09/2022    2:19 PM  Depression screen PHQ 2/9  Decreased Interest 1 1 1  0 0  Down, Depressed, Hopeless 1 3 0 1 0  PHQ - 2 Score 2 4 1 1  0  Altered sleeping 1 1 0 0 0  Tired, decreased energy 1 1 2 2  0  Change in appetite 3 1 0 0 0  Feeling bad or failure about yourself  0 3 2 1  0  Trouble concentrating 1 1 0 0 0  Moving slowly or fidgety/restless 0 1 0 0 0  Suicidal thoughts 0 0 0 0 0  PHQ-9 Score 8 12 5 4  0  Difficult doing work/chores Somewhat difficult Somewhat difficult Not difficult at all Not difficult at  all Not difficult at all   GAD-7 Scores:     06/15/2023   11:20 AM 06/10/2023   11:14 AM 02/08/2023   12:02 PM 12/31/2022   10:39 AM  GAD 7 : Generalized Anxiety Score  Nervous, Anxious, on Edge 2 2 0 1  Control/stop worrying 3 3 0 1  Worry too much - different things 1 2 0 1  Trouble relaxing 1 1 0 1  Restless 0 0 0 0  Easily annoyed or irritable 3 2 0 2  Afraid - awful might happen 1 1 0 0  Total GAD 7 Score 11 11 0 6  Anxiety Difficulty Somewhat difficult Somewhat difficult Not difficult at all Not difficult at all    Stress Current stressors:  Marital conflict, work Sleep:  Fair Appetite:  Fair Coping ability:  Fair Patient taking medications as prescribed:  Yes  Current medications:  Outpatient Encounter Medications as of 06/21/2023  Medication Sig   etonogestrel (NEXPLANON) 68 MG IMPL implant 68 mg by Subdermal route once.   Semaglutide-Weight Management 0.25 MG/0.5ML SOAJ Inject 0.25 mg into the skin once a week for 28 days.   [START ON 07/09/2023] Semaglutide-Weight Management 0.5 MG/0.5ML SOAJ Inject 0.5 mg into the skin once a week for 28 days.   [START ON 08/07/2023] Semaglutide-Weight Management 1 MG/0.5ML SOAJ Inject 1 mg into the skin once a week for 28 days.   [START ON 09/05/2023] Semaglutide-Weight Management 1.7  MG/0.75ML SOAJ Inject 1.7 mg into the skin once a week for 28 days.   [START ON 10/04/2023] Semaglutide-Weight Management 2.4 MG/0.75ML SOAJ Inject 2.4 mg into the skin once a week for 28 days.   sertraline (ZOLOFT) 100 MG tablet Take 100 mg by mouth daily.   No facility-administered encounter medications on file as of 06/21/2023.     Self-harm Behaviors Risk Assessment Self-harm risk factors:  Depression, anxiety, ptsd Patient endorses recent thoughts of harming self:  Denies   Danger to Others Risk Assessment Danger to others risk factors:  None Patient endorses recent thoughts of harming others:  Denies  Substance Use Assessment Patient  recently consumed alcohol:    Alcohol Use Disorder Identification Test (AUDIT):     06/08/2023   12:06 PM  Alcohol Use Disorder Test (AUDIT)  1. How often do you have a drink containing alcohol? 2  2. How many drinks containing alcohol do you have on a typical day when you are drinking? 0  3. How often do you have six or more drinks on one occasion? 1  AUDIT-C Score 3  4. How often during the last year have you found that you were not able to stop drinking once you had started? 0  5. How often during the last year have you failed to do what was normally expected from you because of drinking? 0  6. How often during the last year have you needed a first drink in the morning to get yourself going after a heavy drinking session? 0  7. How often during the last year have you had a feeling of guilt of remorse after drinking? 0  8. How often during the last year have you been unable to remember what happened the night before because you had been drinking? 0  9. Have you or someone else been injured as a result of your drinking? 0  10. Has a relative or friend or a doctor or another health worker been concerned about your drinking or suggested you cut down? 0  Alcohol Use Disorder Identification Test Final Score (AUDIT) 3    Goals, Interventions and Follow-up Plan Goals:  Increase physical activity, educate on CBT/Mindfulness, and improve relationship Interventions: Mindfulness or Relaxation Training, Behavioral Activation, and CBT Cognitive Behavioral Therapy Follow-up Plan:  Weekly visits and discuss transition into traditional therapy.   Reuel Boom

## 2023-06-22 NOTE — Patient Instructions (Addendum)
https://www.therapistaid.com/therapy-worksheet/what-is-mindfulness  https://www.therapistaid.com/worksheets/cognitive-model-example-practice  https://www.therapistaid.com/worksheets/core-beliefs-info-sheet   Look over educational sheets and plan to discuss in next session.

## 2023-06-28 ENCOUNTER — Ambulatory Visit: Payer: Commercial Managed Care - PPO | Admitting: Professional Counselor

## 2023-06-28 DIAGNOSIS — F339 Major depressive disorder, recurrent, unspecified: Secondary | ICD-10-CM

## 2023-06-28 NOTE — BH Specialist Note (Unsigned)
[facility] [facility] Telephone Follow-up  MRN: [id number] NAME: Minahil Quinlivan Date: 06/28/23  Start time: Start Time: 0900 End time: Stop Time: 0930 Total time: Total Time in Minutes (Visit): 30 Call number: Visit Number: 4- Fourth Visit  Reason for call today:  The patient is a 37 year old female returning for a collaborative care follow-up. She reports having a difficult week after attempting to communicate with her husband about her feelings and needs, expressing her desire to talk and clarify the direction of their relationship. Her husband reacted negatively, feeling pressured, and ultimately told her he wanted to end the relationship. She plans to stay with her parents temporarily, which has understandably increased her depression and anxiety. The situation has triggered past traumas related to relationship changes, and she's struggling with the impact on her family, as she has to bring her two children to her mother's as well. During our session, I provided supportive counseling, helping her process the emotions she's experiencing and encouraging her to focus on manageable aspects of her situation. We discussed the possibility of marriage therapy if her husband is open to it, as she expressed a strong desire to work on the relationship and preserve the family unit. I will follow up with her weekly to support her through this challenging adjustment period.  PHQ-9 Scores:     06/15/2023   11:13 AM 06/10/2023   11:15 AM 02/08/2023   12:02 PM 12/31/2022   10:38 AM 02/09/2022    2:19 PM  Depression screen PHQ 2/9  Decreased Interest 1 1 1  0 0  Down, Depressed, Hopeless 1 3 0 1 0  PHQ - 2 Score 2 4 1 1  0  Altered sleeping 1 1 0 0 0  Tired, decreased energy 1 1 2 2  0  Change in appetite 3 1 0 0 0  Feeling bad or failure about yourself  0 3 2 1  0  Trouble concentrating 1 1 0 0 0  Moving slowly or fidgety/restless 0 1 0 0 0  Suicidal thoughts 0 0 0 0 0  PHQ-9 Score 8 12 5 4  0  Difficult  doing work/chores Somewhat difficult Somewhat difficult Not difficult at all Not difficult at all Not difficult at all   GAD-7 Scores:     06/15/2023   11:20 AM 06/10/2023   11:14 AM 02/08/2023   12:02 PM 12/31/2022   10:39 AM  GAD 7 : Generalized Anxiety Score  Nervous, Anxious, on Edge 2 2 0 1  Control/stop worrying 3 3 0 1  Worry too much - different things 1 2 0 1  Trouble relaxing 1 1 0 1  Restless 0 0 0 0  Easily annoyed or irritable 3 2 0 2  Afraid - awful might happen 1 1 0 0  Total GAD 7 Score 11 11 0 6  Anxiety Difficulty Somewhat difficult Somewhat difficult Not difficult at all Not difficult at all    Stress Current stressors:  work, relationship, kids, transition Sleep:  Disrupted Appetite:  Fair Coping ability:  Fair Patient taking medications as prescribed:  Yes  Current medications:  Outpatient Encounter Medications as of 06/28/2023  Medication Sig   etonogestrel (NEXPLANON) 68 MG IMPL implant 68 mg by Subdermal route once.   Semaglutide-Weight Management 0.25 MG/0.5ML SOAJ Inject 0.25 mg into the skin once a week for 28 days.   [START ON 07/09/2023] Semaglutide-Weight Management 0.5 MG/0.5ML SOAJ Inject 0.5 mg into the skin once a week for 28 days.   [START ON 08/07/2023]  Semaglutide-Weight Management 1 MG/0.5ML SOAJ Inject 1 mg into the skin once a week for 28 days.   [START ON 09/05/2023] Semaglutide-Weight Management 1.7 MG/0.75ML SOAJ Inject 1.7 mg into the skin once a week for 28 days.   [START ON 10/04/2023] Semaglutide-Weight Management 2.4 MG/0.75ML SOAJ Inject 2.4 mg into the skin once a week for 28 days.   sertraline (ZOLOFT) 100 MG tablet Take 100 mg by mouth daily.   No facility-administered encounter medications on file as of 06/28/2023.     Self-harm Behaviors Risk Assessment Self-harm risk factors:  Past thoughts Patient endorses recent thoughts of harming self:  Denies  Danger to Others Risk Assessment Danger to others risk factors:   None Patient endorses recent thoughts of harming others:  Denies   Substance Use Assessment Patient recently consumed alcohol:  No  Alcohol Use Disorder Identification Test (AUDIT):     06/08/2023   12:06 PM  Alcohol Use Disorder Test (AUDIT)  1. How often do you have a drink containing alcohol? 2  2. How many drinks containing alcohol do you have on a typical day when you are drinking? 0  3. How often do you have six or more drinks on one occasion? 1  AUDIT-C Score 3  4. How often during the last year have you found that you were not able to stop drinking once you had started? 0  5. How often during the last year have you failed to do what was normally expected from you because of drinking? 0  6. How often during the last year have you needed a first drink in the morning to get yourself going after a heavy drinking session? 0  7. How often during the last year have you had a feeling of guilt of remorse after drinking? 0  8. How often during the last year have you been unable to remember what happened the night before because you had been drinking? 0  9. Have you or someone else been injured as a result of your drinking? 0  10. Has a relative or friend or a doctor or another health worker been concerned about your drinking or suggested you cut down? 0  Alcohol Use Disorder Identification Test Final Score (AUDIT) 3   Goals, Interventions and Follow-up Plan Goals: Increase healthy adjustment to current life circumstances Interventions: Mindfulness or Relaxation Training, Behavioral Activation, and CBT Cognitive Behavioral Therapy Follow-up Plan:  Weekly CBT and supportive counseling   Reuel Boom

## 2023-07-06 ENCOUNTER — Ambulatory Visit: Payer: Commercial Managed Care - PPO | Admitting: Professional Counselor

## 2023-07-06 DIAGNOSIS — F339 Major depressive disorder, recurrent, unspecified: Secondary | ICD-10-CM

## 2023-07-06 NOTE — BH Specialist Note (Signed)
[place] Telephone Follow-up  MRN: [id number] NAME: Katrina Pineda Date: 07/06/23  Start time: Start Time: 1100 End time: Stop Time: 1130 Total time: Total Time in Minutes (Visit): 30 Call number: Visit Number: 5-Fifth Visit  Reason for call today:  The patient is a 37 year old female returning for a Collaborative Care follow-up. She presents with a noticeably improved mood, displaying a bright affect and laughing throughout the session, reflecting a more positive outlook on her circumstances. Last week, her husband had expressed a desire for separation, leading to a difficult period and the decision for her to temporarily move in with her mother. However, their relationship has since taken a positive turn as they've engaged in open communication, sharing their thoughts and feelings, which she has found beneficial. She still plans to stay with her mother for a few weeks to give her husband some space, yet she feels more hopeful about their relationship. Although she continues to face stressors related to work and her children, she appears to be managing them well and is putting effort into improving her mental and physical health. She looks forward to continuing her progress. We will maintain weekly sessions for now, with the potential to transition to biweekly sessions as her stability improves.   PHQ-9 Scores:     07/06/2023   11:10 AM 06/28/2023    9:19 AM 06/15/2023   11:13 AM 06/10/2023   11:15 AM 02/08/2023   12:02 PM  Depression screen PHQ 2/9  Decreased Interest 1 2 1 1 1   Down, Depressed, Hopeless 1 2 1 3  0  PHQ - 2 Score 2 4 2 4 1   Altered sleeping 2 2 1 1  0  Tired, decreased energy 1 2 1 1 2   Change in appetite 1 2 3 1  0  Feeling bad or failure about yourself  1 2 0 3 2  Trouble concentrating 0 1 1 1  0  Moving slowly or fidgety/restless 0 0 0 1 0  Suicidal thoughts 0 0 0 0 0  PHQ-9 Score 7 13 8 12 5   Difficult doing work/chores Not difficult at all Extremely  dIfficult Somewhat difficult Somewhat difficult Not difficult at all   GAD-7 Scores:     07/06/2023   11:11 AM 06/28/2023    9:21 AM 06/15/2023   11:20 AM 06/10/2023   11:14 AM  GAD 7 : Generalized Anxiety Score  Nervous, Anxious, on Edge 1 3 2 2   Control/stop worrying 1 3 3 3   Worry too much - different things 1 2 1 2   Trouble relaxing 1 2 1 1   Restless 1 2 0 0  Easily annoyed or irritable 1 2 3 2   Afraid - awful might happen 1 2 1 1   Total GAD 7 Score 7 16 11 11   Anxiety Difficulty Not difficult at all Very difficult Somewhat difficult Somewhat difficult    Stress Current stressors:  work, Public affairs consultant and marriage Sleep:  Fair Appetite:  Decreased. Taking semaglutide Coping ability:  Good Patient taking medications as prescribed:  Yes  Current medications:  Outpatient Encounter Medications as of 07/06/2023  Medication Sig   etonogestrel (NEXPLANON) 68 MG IMPL implant 68 mg by Subdermal route once.   Semaglutide-Weight Management 0.25 MG/0.5ML SOAJ Inject 0.25 mg into the skin once a week for 28 days.   [START ON 07/09/2023] Semaglutide-Weight Management 0.5 MG/0.5ML SOAJ Inject 0.5 mg into the skin once a week for 28 days.   [START ON 08/07/2023] Semaglutide-Weight Management 1 MG/0.5ML SOAJ Inject 1  mg into the skin once a week for 28 days.   [START ON 09/05/2023] Semaglutide-Weight Management 1.7 MG/0.75ML SOAJ Inject 1.7 mg into the skin once a week for 28 days.   [START ON 10/04/2023] Semaglutide-Weight Management 2.4 MG/0.75ML SOAJ Inject 2.4 mg into the skin once a week for 28 days.   sertraline (ZOLOFT) 100 MG tablet Take 100 mg by mouth daily.   No facility-administered encounter medications on file as of 07/06/2023.     Self-harm Behaviors Risk Assessment Self-harm risk factors:  Past thoughts Patient endorses recent thoughts of harming self:  Denies   Danger to Others Risk Assessment Danger to others risk factors:  None Patient endorses recent thoughts of harming  others:  Denies   Substance Use Assessment Patient recently consumed alcohol:  Yes  Alcohol Use Disorder Identification Test (AUDIT):     06/08/2023   12:06 PM  Alcohol Use Disorder Test (AUDIT)  1. How often do you have a drink containing alcohol? 2  2. How many drinks containing alcohol do you have on a typical day when you are drinking? 0  3. How often do you have six or more drinks on one occasion? 1  AUDIT-C Score 3  4. How often during the last year have you found that you were not able to stop drinking once you had started? 0  5. How often during the last year have you failed to do what was normally expected from you because of drinking? 0  6. How often during the last year have you needed a first drink in the morning to get yourself going after a heavy drinking session? 0  7. How often during the last year have you had a feeling of guilt of remorse after drinking? 0  8. How often during the last year have you been unable to remember what happened the night before because you had been drinking? 0  9. Have you or someone else been injured as a result of your drinking? 0  10. Has a relative or friend or a doctor or another health worker been concerned about your drinking or suggested you cut down? 0  Alcohol Use Disorder Identification Test Final Score (AUDIT) 3    Goals, Interventions and Follow-up Plan Goals:  Improve communication and stress management Interventions: Mindfulness or Relaxation Training, Behavioral Activation, and CBT Cognitive Behavioral Therapy Follow-up Plan:  Weekly counseling   Reuel Boom

## 2023-07-08 ENCOUNTER — Ambulatory Visit: Payer: Commercial Managed Care - PPO | Admitting: Family Medicine

## 2023-07-08 ENCOUNTER — Ambulatory Visit (INDEPENDENT_AMBULATORY_CARE_PROVIDER_SITE_OTHER): Payer: Commercial Managed Care - PPO | Admitting: Family Medicine

## 2023-07-08 ENCOUNTER — Encounter: Payer: Self-pay | Admitting: Family Medicine

## 2023-07-08 DIAGNOSIS — F411 Generalized anxiety disorder: Secondary | ICD-10-CM

## 2023-07-08 DIAGNOSIS — F339 Major depressive disorder, recurrent, unspecified: Secondary | ICD-10-CM

## 2023-07-08 MED ORDER — ONDANSETRON HCL 4 MG PO TABS: 4.0000 mg | ORAL_TABLET | Freq: Three times a day (TID) | ORAL | 0 refills | Status: DC | PRN

## 2023-07-08 NOTE — Progress Notes (Signed)
Established Patient Office Visit  Subjective   Patient ID: Katrina Pineda, female    DOB: 03-05-1986  Age: 37 y.o. MRN: [id number]  Chief Complaint  Patient presents with   Obesity   Depression    Depression        Katrina Pineda is here for follow up of obesity, anxiety, and depression. She has started on wegovy. She has had 3 doses of 0.25 mg so far. Doing well outside of some mild nausea. Doing well with diet. She has been trying to stay active and adding in some walking at work.   Anxiety and depression has been better overall. Has had weekly counseling with some improvement. Compliant with zoloft.      07/08/2023    1:04 PM 07/06/2023   11:10 AM 06/28/2023    9:19 AM  Depression screen PHQ 2/9  Decreased Interest 0 1 2  Down, Depressed, Hopeless 0 1 2  PHQ - 2 Score 0 2 4  Altered sleeping 1 2 2   Tired, decreased energy 1 1 2   Change in appetite 0 1 2  Feeling bad or failure about yourself  0 1 2  Trouble concentrating 0 0 1  Moving slowly or fidgety/restless 0 0 0  Suicidal thoughts 0 0 0  PHQ-9 Score 2 7 13   Difficult doing work/chores Not difficult at all Not difficult at all Extremely dIfficult      07/08/2023    1:04 PM 07/06/2023   11:11 AM 06/28/2023    9:21 AM 06/15/2023   11:20 AM  GAD 7 : Generalized Anxiety Score  Nervous, Anxious, on Edge 1 1 3 2   Control/stop worrying 1 1 3 3   Worry too much - different things 0 1 2 1   Trouble relaxing 0 1 2 1   Restless 0 1 2 0  Easily annoyed or irritable 1 1 2 3   Afraid - awful might happen 0 1 2 1   Total GAD 7 Score 3 7 16 11   Anxiety Difficulty Not difficult at all Not difficult at all Very difficult Somewhat difficult        Review of Systems  Psychiatric/Behavioral:  Positive for depression.    As per HPI.   Objective:     BP 116/76   Pulse 79   Temp 97.7 F (36.5 C) (Temporal)   Ht 5\' 5"  (1.651 m)   Wt 251 lb (113.9 kg)   SpO2 98%   BMI 41.77 kg/m  Wt Readings from Last 3 Encounters:   07/08/23 251 lb (113.9 kg)  06/10/23 257 lb 6 oz (116.7 kg)  06/07/23 259 lb (117.5 kg)      Physical Exam Vitals and nursing note reviewed.  Constitutional:      General: She is not in acute distress.    Appearance: She is obese. She is not ill-appearing, toxic-appearing or diaphoretic.  Cardiovascular:     Rate and Rhythm: Normal rate and regular rhythm.     Heart sounds: Normal heart sounds. No murmur heard. Pulmonary:     Effort: Pulmonary effort is normal. No respiratory distress.     Breath sounds: Normal breath sounds. No wheezing.  Musculoskeletal:     Right lower leg: No edema.     Left lower leg: No edema.  Skin:    General: Skin is warm.  Neurological:     General: No focal deficit present.     Mental Status: She is alert and oriented to person, place, and time.  Psychiatric:  Mood and Affect: Mood normal.        Behavior: Behavior normal.      No results found for any visits on 07/08/23.    The ASCVD Risk score (Arnett DK, et al., 2019) failed to calculate for the following reasons:   The 2019 ASCVD risk score is only valid for ages 64 to 16    Assessment & Plan:   Katrina Pineda was seen today for obesity and depression.  Diagnoses and all orders for this visit:  Morbid obesity (HCC) Down 6 lbs since starting wegovy. Continue wegovy, diet, and exercise. Will recheck BMP today.  -     ondansetron (ZOFRAN) 4 MG tablet; Take 1 tablet (4 mg total) by mouth every 8 (eight) hours as needed for nausea or vomiting. -     BMP8+EGFR  Depression, recurrent (HCC) Generalized anxiety disorder Improving. Denies SI. Continue zoloft, counseling.    Return in about 6 weeks (around 08/19/2023) for weight, anxiety.   The patient indicates understanding of these issues and agrees with the plan.  Gabriel Earing, FNP

## 2023-07-09 LAB — BMP8+EGFR
BUN/Creatinine Ratio: 11 (ref 9–23)
BUN: 6 mg/dL (ref 6–20)
CO2: 18 mmol/L — ABNORMAL LOW (ref 20–29)
Calcium: 9.1 mg/dL (ref 8.7–10.2)
Chloride: 107 mmol/L — ABNORMAL HIGH (ref 96–106)
Creatinine, Ser: 0.56 mg/dL — ABNORMAL LOW (ref 0.57–1.00)
Glucose: 84 mg/dL (ref 70–99)
Potassium: 4.2 mmol/L (ref 3.5–5.2)
Sodium: 141 mmol/L (ref 134–144)
eGFR: 122 mL/min/{1.73_m2} (ref >59)

## 2023-07-12 ENCOUNTER — Ambulatory Visit: Payer: Commercial Managed Care - PPO | Admitting: Professional Counselor

## 2023-07-12 DIAGNOSIS — F330 Major depressive disorder, recurrent, mild: Secondary | ICD-10-CM

## 2023-07-12 NOTE — BH Specialist Note (Signed)
[facility] [place] [facility] Telephone Follow-up  MRN: [id number] NAME: Katrina Pineda Date: 07/16/23  Start time: Start Time: 0900 End time: Stop Time: 0930 Total time: Total Time in Minutes (Visit): 30 Call number: Visit Number: 6-Sixth Visit  Reason for call today:   The patient is a 37 year old female who presented for a Collaborative Care follow-up session. She is currently navigating ongoing marital conflict, having temporarily moved in with her mother as she and her husband take a break. She reports feeling conflicted and uncertain about how to address the situation, often second-guessing herself and overthinking. During the session, the behavioral health counselor discussed applying CBT techniques, focusing on challenging negative thought patterns, avoiding catastrophizing, and reducing rumination. Behavioral activation strategies were also introduced, emphasizing the importance of staying active and prioritizing self-care during this challenging time. The patient expressed a sense of cautious hopefulness, noting some improvement in communication with her husband and occasional positive interactions, which were previously absent. Weekly follow-up sessions are planned to provide ongoing support as she works through her concerns. At present, she is stable and functioning, with a primary goal of navigating this situation with support.   PHQ-9 Scores:     07/08/2023    1:04 PM 07/06/2023   11:10 AM 06/28/2023    9:19 AM 06/15/2023   11:13 AM 06/10/2023   11:15 AM  Depression screen PHQ 2/9  Decreased Interest 0 1 2 1 1   Down, Depressed, Hopeless 0 1 2 1 3   PHQ - 2 Score 0 2 4 2 4   Altered sleeping 1 2 2 1 1   Tired, decreased energy 1 1 2 1 1   Change in appetite 0 1 2 3 1   Feeling bad or failure about yourself  0 1 2 0 3  Trouble concentrating 0 0 1 1 1   Moving slowly or fidgety/restless 0 0 0 0 1  Suicidal thoughts 0 0 0 0 0  PHQ-9 Score 2 7 13 8 12   Difficult doing work/chores Not  difficult at all Not difficult at all Extremely dIfficult Somewhat difficult Somewhat difficult   GAD-7 Scores:     07/08/2023    1:04 PM 07/06/2023   11:11 AM 06/28/2023    9:21 AM 06/15/2023   11:20 AM  GAD 7 : Generalized Anxiety Score  Nervous, Anxious, on Edge 1 1 3 2   Control/stop worrying 1 1 3 3   Worry too much - different things 0 1 2 1   Trouble relaxing 0 1 2 1   Restless 0 1 2 0  Easily annoyed or irritable 1 1 2 3   Afraid - awful might happen 0 1 2 1   Total GAD 7 Score 3 7 16 11   Anxiety Difficulty Not difficult at all Not difficult at all Very difficult Somewhat difficult    Stress Current stressors:  work, Public affairs consultant, marriage Sleep:  fair Appetite:  good Coping ability:  fair Patient taking medications as prescribed:  Yes  Current medications:  Outpatient Encounter Medications as of 07/12/2023  Medication Sig   etonogestrel (NEXPLANON) 68 MG IMPL implant 68 mg by Subdermal route once.   ondansetron (ZOFRAN) 4 MG tablet Take 1 tablet (4 mg total) by mouth every 8 (eight) hours as needed for nausea or vomiting.   Semaglutide-Weight Management 0.5 MG/0.5ML SOAJ Inject 0.5 mg into the skin once a week for 28 days. (Patient not taking: Reported on 07/08/2023)   [START ON 08/07/2023] Semaglutide-Weight Management 1 MG/0.5ML SOAJ Inject 1 mg into the skin once a week for 28  days. (Patient not taking: Reported on 07/08/2023)   [START ON 09/05/2023] Semaglutide-Weight Management 1.7 MG/0.75ML SOAJ Inject 1.7 mg into the skin once a week for 28 days. (Patient not taking: Reported on 07/08/2023)   [START ON 10/04/2023] Semaglutide-Weight Management 2.4 MG/0.75ML SOAJ Inject 2.4 mg into the skin once a week for 28 days. (Patient not taking: Reported on 07/08/2023)   sertraline (ZOLOFT) 100 MG tablet Take 100 mg by mouth daily.   No facility-administered encounter medications on file as of 07/12/2023.     Self-harm Behaviors Risk Assessment Self-harm risk factors:  Past  thoughts Patient endorses recent thoughts of harming self:  Denies   Danger to Others Risk Assessment Danger to others risk factors:  None Patient endorses recent thoughts of harming others:  Denies    Goals, Interventions and Follow-up Plan Goals:  Increase self care and decrease self defeating thought patterns Interventions: Mindfulness or Relaxation Training, Behavioral Activation, and CBT Cognitive Behavioral Therapy Follow-up Plan:  Weekly counseling  Reuel Boom

## 2023-07-19 ENCOUNTER — Ambulatory Visit: Payer: Commercial Managed Care - PPO | Admitting: Professional Counselor

## 2023-07-19 DIAGNOSIS — F330 Major depressive disorder, recurrent, mild: Secondary | ICD-10-CM | POA: Diagnosis not present

## 2023-07-21 NOTE — BH Specialist Note (Signed)
[facility] Telephone Follow-up  MRN: [id number] NAME: Katrina Pineda Date: 07/21/23  Start time: Start Time: 0200 End time: Stop Time: 0230 Total time: Total Time in Minutes (Visit): 30 Call number: Visit Number: Additional Visit  Reason for call today:  The patient is a 37 year old female returning for a collaborative care follow-up. She presents with a somewhat depressed mood, primarily stemming from ongoing marital conflict. The patient has temporarily moved in with her mother and reports feeling uncertain and conflicted about the situation. She is contemplating a formal separation or divorce if they cannot resolve their issues soon.  The patient expressed frustration and confusion, citing mixed messages from her husband. While she desires to reconcile and work through the challenges, she is also focused on maintaining boundaries and prioritizing her mental health and the well-being of her children. She recognizes the potential need to move forward independently if her husband's behavior does not change.  Supportive counseling was provided to help the patient process her emotions and explore her options. Marriage therapy was recommended as an appropriate next step, and the patient is open to pursuing this. Efforts are underway to connect her with a marriage therapist. In the meantime, collaborative care will continue to support her until she transitions to appropriate therapy. A follow-up session is scheduled in two weeks.  PHQ-9 Scores:     07/08/2023    1:04 PM 07/06/2023   11:10 AM 06/28/2023    9:19 AM 06/15/2023   11:13 AM 06/10/2023   11:15 AM  Depression screen PHQ 2/9  Decreased Interest 0 1 2 1 1   Down, Depressed, Hopeless 0 1 2 1 3   PHQ - 2 Score 0 2 4 2 4   Altered sleeping 1 2 2 1 1   Tired, decreased energy 1 1 2 1 1   Change in appetite 0 1 2 3 1   Feeling bad or failure about yourself  0 1 2 0 3  Trouble concentrating 0 0 1 1 1   Moving slowly or  fidgety/restless 0 0 0 0 1  Suicidal thoughts 0 0 0 0 0  PHQ-9 Score 2 7 13 8 12   Difficult doing work/chores Not difficult at all Not difficult at all Extremely dIfficult Somewhat difficult Somewhat difficult   GAD-7 Scores:     07/08/2023    1:04 PM 07/06/2023   11:11 AM 06/28/2023    9:21 AM 06/15/2023   11:20 AM  GAD 7 : Generalized Anxiety Score  Nervous, Anxious, on Edge 1 1 3 2   Control/stop worrying 1 1 3 3   Worry too much - different things 0 1 2 1   Trouble relaxing 0 1 2 1   Restless 0 1 2 0  Easily annoyed or irritable 1 1 2 3   Afraid - awful might happen 0 1 2 1   Total GAD 7 Score 3 7 16 11   Anxiety Difficulty Not difficult at all Not difficult at all Very difficult Somewhat difficult    Stress Current stressors:  Work, relationship, kids Sleep:  Fair Appetite:  Fair Coping ability:  Fair Patient taking medications as prescribed:  Yes  Current medications:  Outpatient Encounter Medications as of 07/19/2023  Medication Sig   etonogestrel (NEXPLANON) 68 MG IMPL implant 68 mg by Subdermal route once.   ondansetron (ZOFRAN) 4 MG tablet Take 1 tablet (4 mg total) by mouth every 8 (eight) hours as needed for nausea or vomiting.   Semaglutide-Weight Management 0.5 MG/0.5ML SOAJ Inject 0.5 mg into the skin once a week  for 28 days. (Patient not taking: Reported on 07/08/2023)   [START ON 08/07/2023] Semaglutide-Weight Management 1 MG/0.5ML SOAJ Inject 1 mg into the skin once a week for 28 days. (Patient not taking: Reported on 07/08/2023)   [START ON 09/05/2023] Semaglutide-Weight Management 1.7 MG/0.75ML SOAJ Inject 1.7 mg into the skin once a week for 28 days. (Patient not taking: Reported on 07/08/2023)   [START ON 10/04/2023] Semaglutide-Weight Management 2.4 MG/0.75ML SOAJ Inject 2.4 mg into the skin once a week for 28 days. (Patient not taking: Reported on 07/08/2023)   sertraline (ZOLOFT) 100 MG tablet Take 100 mg by mouth daily.   No facility-administered encounter  medications on file as of 07/19/2023.     Self-harm Behaviors Risk Assessment Self-harm risk factors:  Past thoughts Patient endorses recent thoughts of harming self:  Denies  Danger to Others Risk Assessment Danger to others risk factors:  None Patient endorses recent thoughts of harming others:  Denies   Substance Use Assessment Patient recently consumed alcohol:  No  Alcohol Use Disorder Identification Test (AUDIT):     06/08/2023   12:06 PM  Alcohol Use Disorder Test (AUDIT)  1. How often do you have a drink containing alcohol? 2  2. How many drinks containing alcohol do you have on a typical day when you are drinking? 0  3. How often do you have six or more drinks on one occasion? 1  AUDIT-C Score 3  4. How often during the last year have you found that you were not able to stop drinking once you had started? 0  5. How often during the last year have you failed to do what was normally expected from you because of drinking? 0  6. How often during the last year have you needed a first drink in the morning to get yourself going after a heavy drinking session? 0  7. How often during the last year have you had a feeling of guilt of remorse after drinking? 0  8. How often during the last year have you been unable to remember what happened the night before because you had been drinking? 0  9. Have you or someone else been injured as a result of your drinking? 0  10. Has a relative or friend or a doctor or another health worker been concerned about your drinking or suggested you cut down? 0  Alcohol Use Disorder Identification Test Final Score (AUDIT) 3   Goals, Interventions and Follow-up Plan Goals: Increase healthy adjustment to current life circumstances Interventions: Mindfulness or Relaxation Training, Behavioral Activation, and CBT Cognitive Behavioral Therapy Follow-up Plan:  Bi-weekly therapy   Reuel Boom

## 2023-08-23 ENCOUNTER — Encounter: Payer: Self-pay | Admitting: Family Medicine

## 2023-08-23 ENCOUNTER — Ambulatory Visit (INDEPENDENT_AMBULATORY_CARE_PROVIDER_SITE_OTHER): Payer: Commercial Managed Care - PPO | Admitting: Family Medicine

## 2023-08-23 DIAGNOSIS — F411 Generalized anxiety disorder: Secondary | ICD-10-CM | POA: Diagnosis not present

## 2023-08-23 DIAGNOSIS — F339 Major depressive disorder, recurrent, unspecified: Secondary | ICD-10-CM

## 2023-08-23 MED ORDER — SERTRALINE HCL 100 MG PO TABS: 100.0000 mg | ORAL_TABLET | Freq: Every day | ORAL | 3 refills | Status: AC

## 2023-08-23 NOTE — Progress Notes (Signed)
Established Patient Office Visit  Subjective   Patient ID: Katrina Pineda, female    DOB: 1985-12-27  Age: 37 y.o. MRN: [id number]  Chief Complaint  Patient presents with   Obesity   Depression    Depression        Gelsomina is here for weight management. She has been doing well with wegovy so far. Currently on 1 mg dosage weekly. She has occasional nasuea but denies other side effects. Limited exercise due to time constraints. She has reduced portion sizes and more well balanced overall. She has lost 7 lbs since starting Wegovy.   She reports doing well with zoloft. The holidays did increase her symptoms however she feels like this is improving again now that the holidays have passed. She does not desire a change in regimen currently.       08/23/2023    4:08 PM 07/08/2023    1:04 PM 07/06/2023   11:10 AM  Depression screen PHQ 2/9  Decreased Interest 1 0 1  Down, Depressed, Hopeless 1 0 1  PHQ - 2 Score 2 0 2  Altered sleeping 1 1 2   Tired, decreased energy 1 1 1   Change in appetite 1 0 1  Feeling bad or failure about yourself  1 0 1  Trouble concentrating 0 0 0  Moving slowly or fidgety/restless 0 0 0  Suicidal thoughts 0 0 0  PHQ-9 Score 6 2 7   Difficult doing work/chores Not difficult at all Not difficult at all Not difficult at all      08/23/2023    4:09 PM 07/08/2023    1:04 PM 07/06/2023   11:11 AM 06/28/2023    9:21 AM  GAD 7 : Generalized Anxiety Score  Nervous, Anxious, on Edge 1 1 1 3   Control/stop worrying 1 1 1 3   Worry too much - different things 0 0 1 2  Trouble relaxing 0 0 1 2  Restless 0 0 1 2  Easily annoyed or irritable 2 1 1 2   Afraid - awful might happen 1 0 1 2  Total GAD 7 Score 5 3 7 16   Anxiety Difficulty Not difficult at all Not difficult at all Not difficult at all Very difficult        Review of Systems  Psychiatric/Behavioral:  Positive for depression.    As per HPI.   Objective:     BP 112/77   Pulse 80   Temp 98 F  (36.7 C) (Temporal)   Ht 5\' 5"  (1.651 m)   Wt 244 lb (110.7 kg)   SpO2 99%   BMI 40.60 kg/m  Wt Readings from Last 3 Encounters:  08/23/23 244 lb (110.7 kg)  07/08/23 251 lb (113.9 kg)  06/10/23 257 lb 6 oz (116.7 kg)      Physical Exam Vitals and nursing note reviewed.  Constitutional:      General: She is not in acute distress.    Appearance: She is not ill-appearing, toxic-appearing or diaphoretic.  Cardiovascular:     Rate and Rhythm: Normal rate and regular rhythm.     Heart sounds: Normal heart sounds. No murmur heard. Pulmonary:     Effort: Pulmonary effort is normal. No respiratory distress.     Breath sounds: No wheezing, rhonchi or rales.  Skin:    General: Skin is warm and dry.  Neurological:     General: No focal deficit present.     Mental Status: She is alert and oriented to person, place, and  time.  Psychiatric:        Mood and Affect: Mood normal.        Behavior: Behavior normal.      No results found for any visits on 08/23/23.    The ASCVD Risk score (Arnett DK, et al., 2019) failed to calculate for the following reasons:   The 2019 ASCVD risk score is only valid for ages 38 to 59    Assessment & Plan:   Morbid obesity (HCC) Doing well with Wegovy so far. Down 7 lbs since starting. Continue well balanced diet, exercise. Titration dosages discussed. Continue zofran prn.   Depression, recurrent (HCC) Generalized anxiety disorder Stable. Denies SI. Continue zoloft as below.  -     Sertraline HCl; Take 1 tablet (100 mg total) by mouth daily. Take 100 mg by mouth daily.  Dispense: 90 tablet; Refill: 3  Return in about 6 weeks (around 10/04/2023) for medication follow up.   The patient indicates understanding of these issues and agrees with the plan.  Gabriel Earing, FNP

## 2023-08-30 ENCOUNTER — Ambulatory Visit (INDEPENDENT_AMBULATORY_CARE_PROVIDER_SITE_OTHER): Payer: Commercial Managed Care - PPO | Admitting: Professional Counselor

## 2023-08-30 DIAGNOSIS — F330 Major depressive disorder, recurrent, mild: Secondary | ICD-10-CM

## 2023-08-31 NOTE — BH Specialist Note (Signed)
 [facility] Telephone Follow-up  MRN: [id number] NAME: Katrina Pineda Date: 08/31/23  Start time: Start Time: 0200 End time: Stop Time: 0215 Total time: Total Time in Minutes (Visit): 15 Call number: Visit Number: Additional Visit  Reason for call today:  The patient is a 38 year old female returning for a collaborative care follow-up, approximately one month since her last visit. She reports ongoing challenges navigating a separation from her fianc, with whom she shares a child. She recently moved in with her mother and feels torn due to her fianc's mixed signals about their relationship, leaving her unsure about the direction she should take. The patient continues to experience both good and bad days, marked by depressive episodes and feelings of anxiety. She remains satisfied with her current medication regimen but expressed a need for someone to talk to about her situation. During the session, the Behavioral Health Counselor explained that collaborative care might not be the best long-term fit, as her circumstances may benefit more from specialized therapy with a marriage and family therapist, potentially including couples therapy with her fianc. The patient expressed openness to this suggestion but wishes to continue with collaborative care for now. The referral process will be initiated, and bi-weekly sessions will be scheduled in the interim to provide supportive counseling, CBT, and brief interventions to help her navigate her current situation.  PHQ-9 Scores:     08/23/2023    4:08 PM 07/08/2023    1:04 PM 07/06/2023   11:10 AM 06/28/2023    9:19 AM 06/15/2023   11:13 AM  Depression screen PHQ 2/9  Decreased Interest 1 0 1 2 1   Down, Depressed, Hopeless 1 0 1 2 1   PHQ - 2 Score 2 0 2 4 2   Altered sleeping 1 1 2 2 1   Tired, decreased energy 1 1 1 2 1   Change in appetite 1 0 1 2 3   Feeling bad or failure about yourself  1 0 1 2 0  Trouble concentrating 0 0 0 1 1  Moving  slowly or fidgety/restless 0 0 0 0 0  Suicidal thoughts 0 0 0 0 0  PHQ-9 Score 6 2 7 13 8   Difficult doing work/chores Not difficult at all Not difficult at all Not difficult at all Extremely dIfficult Somewhat difficult   GAD-7 Scores:     08/23/2023    4:09 PM 07/08/2023    1:04 PM 07/06/2023   11:11 AM 06/28/2023    9:21 AM  GAD 7 : Generalized Anxiety Score  Nervous, Anxious, on Edge 1 1 1 3   Control/stop worrying 1 1 1 3   Worry too much - different things 0 0 1 2  Trouble relaxing 0 0 1 2  Restless 0 0 1 2  Easily annoyed or irritable 2 1 1 2   Afraid - awful might happen 1 0 1 2  Total GAD 7 Score 5 3 7 16   Anxiety Difficulty Not difficult at all Not difficult at all Not difficult at all Very difficult    Stress Current stressors:  relationship, work, kids Sleep:  Fair Appetite:  Good Coping ability:  Fair Patient taking medications as prescribed:  Yes  Current medications:  Outpatient Encounter Medications as of 08/30/2023  Medication Sig   etonogestrel (NEXPLANON) 68 MG IMPL implant 68 mg by Subdermal route once.   ondansetron  (ZOFRAN ) 4 MG tablet Take 1 tablet (4 mg total) by mouth every 8 (eight) hours as needed for nausea or vomiting.   Semaglutide -Weight Management 1  MG/0.5ML SOAJ Inject 1 mg into the skin once a week for 28 days.   [START ON 09/05/2023] Semaglutide -Weight Management 1.7 MG/0.75ML SOAJ Inject 1.7 mg into the skin once a week for 28 days. (Patient not taking: Reported on 08/23/2023)   [START ON 10/04/2023] Semaglutide -Weight Management 2.4 MG/0.75ML SOAJ Inject 2.4 mg into the skin once a week for 28 days. (Patient not taking: Reported on 08/23/2023)   sertraline  (ZOLOFT ) 100 MG tablet Take 1 tablet (100 mg total) by mouth daily. Take 100 mg by mouth daily.   No facility-administered encounter medications on file as of 08/30/2023.     Self-harm Behaviors Risk Assessment Self-harm risk factors:  Past thoughts Patient endorses recent thoughts of harming  self:  Denies   Danger to Others Risk Assessment Danger to others risk factors:  None Patient endorses recent thoughts of harming others:  Denies   Substance Use Assessment Patient recently consumed alcohol:  No  Alcohol Use Disorder Identification Test (AUDIT):     06/08/2023   12:06 PM 08/22/2023    1:31 AM  Alcohol Use Disorder Test (AUDIT)  1. How often do you have a drink containing alcohol? 2 2  2. How many drinks containing alcohol do you have on a typical day when you are drinking? 0 0  3. How often do you have six or more drinks on one occasion? 1 1  AUDIT-C Score 3  3   4. How often during the last year have you found that you were not able to stop drinking once you had started? 0 0  5. How often during the last year have you failed to do what was normally expected from you because of drinking? 0 0  6. How often during the last year have you needed a first drink in the morning to get yourself going after a heavy drinking session? 0 0  7. How often during the last year have you had a feeling of guilt of remorse after drinking? 0 0  8. How often during the last year have you been unable to remember what happened the night before because you had been drinking? 0 0  9. Have you or someone else been injured as a result of your drinking? 0 0  10. Has a relative or friend or a doctor or another health worker been concerned about your drinking or suggested you cut down? 0 0  Alcohol Use Disorder Identification Test Final Score (AUDIT) 3  3      Patient-reported   Goals, Interventions and Follow-up Plan Goals:  Increase self care and boundaries in relationships Interventions: CBT Cognitive Behavioral Therapy and Supportive Counseling Follow-up Plan:  Refer to marriage and family therapist   Redell JINNY Corn

## 2023-09-10 ENCOUNTER — Other Ambulatory Visit: Payer: Self-pay | Admitting: Family Medicine

## 2023-09-10 MED ORDER — ONDANSETRON HCL 4 MG PO TABS
4.0000 mg | ORAL_TABLET | Freq: Three times a day (TID) | ORAL | 0 refills | Status: DC | PRN
Start: 2023-09-10 — End: 2023-10-08

## 2023-10-08 ENCOUNTER — Encounter: Payer: Self-pay | Admitting: Family Medicine

## 2023-10-08 ENCOUNTER — Ambulatory Visit (INDEPENDENT_AMBULATORY_CARE_PROVIDER_SITE_OTHER): Payer: Commercial Managed Care - PPO | Admitting: Family Medicine

## 2023-10-08 DIAGNOSIS — F411 Generalized anxiety disorder: Secondary | ICD-10-CM | POA: Diagnosis not present

## 2023-10-08 DIAGNOSIS — F339 Major depressive disorder, recurrent, unspecified: Secondary | ICD-10-CM

## 2023-10-08 MED ORDER — ONDANSETRON HCL 4 MG PO TABS: 4.0000 mg | ORAL_TABLET | Freq: Three times a day (TID) | ORAL | 0 refills | Status: AC | PRN

## 2023-10-08 NOTE — Progress Notes (Signed)
Established Patient Office Visit  Subjective   Patient ID: Katrina Pineda, female    DOB: 10/02/1985  Age: 38 y.o. MRN: [id number]  Chief Complaint  Patient presents with   Medical Management of Chronic Issues    6 week follow up, Wegovy    HPI Katrina Pineda is here for weight management. She has been doing well with wegovy so far. Will bump up to 2.4 mg this week. Denies side effects. She reports a well balanced diet. She has been trying to walk regularly. This has been somewhat hindered by the weather. She stays active during the day.   Starting weight: 265  She reports doing well with zoloft, especially over the last few week. Denies any side effects.       10/08/2023    4:04 PM 08/23/2023    4:08 PM 07/08/2023    1:04 PM  Depression screen PHQ 2/9  Decreased Interest 0 1 0  Down, Depressed, Hopeless 0 1 0  PHQ - 2 Score 0 2 0  Altered sleeping  1 1  Tired, decreased energy 0 1 1  Change in appetite 0 1 0  Feeling bad or failure about yourself  0 1 0  Trouble concentrating 0 0 0  Moving slowly or fidgety/restless 0 0 0  Suicidal thoughts 0 0 0  PHQ-9 Score  6 2  Difficult doing work/chores  Not difficult at all Not difficult at all      10/08/2023    4:05 PM 08/23/2023    4:09 PM 07/08/2023    1:04 PM 07/06/2023   11:11 AM  GAD 7 : Generalized Anxiety Score  Nervous, Anxious, on Edge 0 1 1 1   Control/stop worrying 0 1 1 1   Worry too much - different things 0 0 0 1  Trouble relaxing 0 0 0 1  Restless 0 0 0 1  Easily annoyed or irritable 0 2 1 1   Afraid - awful might happen 0 1 0 1  Total GAD 7 Score 0 5 3 7   Anxiety Difficulty  Not difficult at all Not difficult at all Not difficult at all        ROS As per HPI.   Objective:     BP 111/71   Pulse 69   Temp 98.3 F (36.8 C)   Ht 5\' 5"  (1.651 m)   Wt 236 lb (107 kg)   SpO2 99%   BMI 39.27 kg/m  Wt Readings from Last 3 Encounters:  10/08/23 236 lb (107 kg)  08/23/23 244 lb (110.7 kg)  07/08/23 251  lb (113.9 kg)      Physical Exam Vitals and nursing note reviewed.  Constitutional:      General: She is not in acute distress.    Appearance: She is not ill-appearing, toxic-appearing or diaphoretic.  Cardiovascular:     Rate and Rhythm: Normal rate and regular rhythm.     Heart sounds: Normal heart sounds. No murmur heard. Pulmonary:     Effort: Pulmonary effort is normal. No respiratory distress.     Breath sounds: No wheezing, rhonchi or rales.  Skin:    General: Skin is warm and dry.  Neurological:     General: No focal deficit present.     Mental Status: She is alert and oriented to person, place, and time.  Psychiatric:        Mood and Affect: Mood normal.        Behavior: Behavior normal.      No  results found for any visits on 10/08/23.    The ASCVD Risk score (Arnett DK, et al., 2019) failed to calculate for the following reasons:   The 2019 ASCVD risk score is only valid for ages 7 to 56    Assessment & Plan:   Morbid obesity (HCC) Doing well with Wegovy so far. Down 29 lbs since starting. Continue well balanced diet, exercise. Continue zofran prn.   Depression, recurrent (HCC) Generalized anxiety disorder Well controlled. Continue zoloft as below.    Return in about 6 weeks (around 11/19/2023) for medication follow up.   The patient indicates understanding of these issues and agrees with the plan.  Gabriel Earing, FNP

## 2023-10-11 ENCOUNTER — Encounter: Payer: Self-pay | Admitting: Family Medicine

## 2023-10-11 ENCOUNTER — Telehealth: Payer: Self-pay | Admitting: Family Medicine

## 2023-10-11 ENCOUNTER — Telehealth: Payer: Self-pay

## 2023-10-11 NOTE — Telephone Encounter (Signed)
 Ok for note

## 2023-10-11 NOTE — Telephone Encounter (Signed)
 Copied from CRM [id number]. Topic: General - Other >> Oct 11, 2023  8:50 AM Elle L wrote: Reason for CRM: The patient is requesting that her note from Friday be rewritten as her return date to be today Monday 2/17 as she was too nauseous from her [facility] on Saturday to return to work. The patient's call back number is [phone]. She does not have access to MyChart and is requesting to pick it up from the office.

## 2023-10-11 NOTE — Telephone Encounter (Signed)
 Refer to other phone note .

## 2023-10-11 NOTE — Telephone Encounter (Signed)
 Copied from CRM [id number]. Topic: General - Other >> Oct 11, 2023  1:12 PM Eunice Blase wrote: Reason for CRM: Pt called needs absence note for work extended for returning to work today 10/11/23. Please call pt [phone]

## 2023-10-11 NOTE — Telephone Encounter (Signed)
 Refer to [employer]

## 2024-01-27 ENCOUNTER — Telehealth: Payer: Self-pay

## 2024-01-27 ENCOUNTER — Other Ambulatory Visit (HOSPITAL_COMMUNITY): Payer: Self-pay

## 2024-01-27 NOTE — Telephone Encounter (Signed)
 Approved.

## 2024-01-27 NOTE — Telephone Encounter (Signed)
 Pharmacy Patient Advocate Encounter   Received notification from CoverMyMeds that prior authorization for Wegovy  2.4 is required/requested.   Insurance verification completed.   The patient is insured through [employer] .   Per test claim: PA required; PA submitted to above mentioned insurance via CoverMyMeds Key/confirmation #/EOC [id number] Status is pending

## 2024-01-27 NOTE — Telephone Encounter (Signed)
 Pharmacy Patient Advocate Encounter  Received notification from [employer] that Prior Authorization for Wegovy  2.4 has been APPROVED from 01/27/24 to 01/26/25. Ran test claim, Copay is $24.99. This test claim was processed through [facility]- copay amounts may vary at other pharmacies due to pharmacy/plan contracts, or as the patient moves through the different stages of their insurance plan.   PA #/Case ID/Reference #: [id number]
